# Patient Record
Sex: Female | Born: 1975 | Race: White | Hispanic: No | Marital: Married | Smoking: Former smoker
Health system: Southern US, Community
[De-identification: ages and names within clinical notes are randomized; demographics above are authoritative.]

## PROBLEM LIST (undated history)

## (undated) DIAGNOSIS — M1990 Unspecified osteoarthritis, unspecified site: Secondary | ICD-10-CM

## (undated) DIAGNOSIS — Q760 Spina bifida occulta: Secondary | ICD-10-CM

## (undated) DIAGNOSIS — E119 Type 2 diabetes mellitus without complications: Secondary | ICD-10-CM

## (undated) DIAGNOSIS — G43909 Migraine, unspecified, not intractable, without status migrainosus: Secondary | ICD-10-CM

## (undated) DIAGNOSIS — IMO0002 Reserved for concepts with insufficient information to code with codable children: Secondary | ICD-10-CM

## (undated) DIAGNOSIS — Z8489 Family history of other specified conditions: Secondary | ICD-10-CM

## (undated) DIAGNOSIS — K760 Fatty (change of) liver, not elsewhere classified: Secondary | ICD-10-CM

## (undated) DIAGNOSIS — T4145XA Adverse effect of unspecified anesthetic, initial encounter: Secondary | ICD-10-CM

## (undated) DIAGNOSIS — K219 Gastro-esophageal reflux disease without esophagitis: Secondary | ICD-10-CM

## (undated) DIAGNOSIS — F419 Anxiety disorder, unspecified: Secondary | ICD-10-CM

## (undated) DIAGNOSIS — I340 Nonrheumatic mitral (valve) insufficiency: Secondary | ICD-10-CM

## (undated) DIAGNOSIS — F32A Depression, unspecified: Secondary | ICD-10-CM

## (undated) DIAGNOSIS — T8859XA Other complications of anesthesia, initial encounter: Secondary | ICD-10-CM

## (undated) DIAGNOSIS — I10 Essential (primary) hypertension: Secondary | ICD-10-CM

## (undated) DIAGNOSIS — E7800 Pure hypercholesterolemia, unspecified: Secondary | ICD-10-CM

## (undated) DIAGNOSIS — F329 Major depressive disorder, single episode, unspecified: Secondary | ICD-10-CM

## (undated) HISTORY — DX: Depression, unspecified: F32.A

## (undated) HISTORY — PX: OTHER SURGICAL HISTORY: SHX169

## (undated) HISTORY — DX: Major depressive disorder, single episode, unspecified: F32.9

## (undated) HISTORY — PX: STERNAL WIRE REMOVAL: SHX2440

## (undated) HISTORY — DX: Anxiety disorder, unspecified: F41.9

## (undated) HISTORY — DX: Type 2 diabetes mellitus without complications: E11.9

## (undated) HISTORY — PX: NOSE SURGERY: SHX723

## (undated) HISTORY — PX: APPENDECTOMY: SHX54

## (undated) HISTORY — PX: ABDOMINAL HYSTERECTOMY: SHX81

---
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
## 2013-01-28 ENCOUNTER — Ambulatory Visit: Payer: Self-pay | Admitting: Internal Medicine

## 2013-02-07 ENCOUNTER — Ambulatory Visit: Payer: Self-pay | Admitting: Internal Medicine

## 2013-08-08 DIAGNOSIS — E119 Type 2 diabetes mellitus without complications: Secondary | ICD-10-CM | POA: Insufficient documentation

## 2013-08-08 DIAGNOSIS — I10 Essential (primary) hypertension: Secondary | ICD-10-CM | POA: Insufficient documentation

## 2013-08-08 DIAGNOSIS — F064 Anxiety disorder due to known physiological condition: Secondary | ICD-10-CM | POA: Insufficient documentation

## 2013-08-08 DIAGNOSIS — Q0700 Arnold-Chiari syndrome without spina bifida or hydrocephalus: Secondary | ICD-10-CM | POA: Insufficient documentation

## 2013-08-08 DIAGNOSIS — F32A Depression, unspecified: Secondary | ICD-10-CM | POA: Insufficient documentation

## 2013-08-08 DIAGNOSIS — F329 Major depressive disorder, single episode, unspecified: Secondary | ICD-10-CM | POA: Insufficient documentation

## 2013-08-24 DIAGNOSIS — M069 Rheumatoid arthritis, unspecified: Secondary | ICD-10-CM | POA: Insufficient documentation

## 2013-08-24 DIAGNOSIS — IMO0001 Reserved for inherently not codable concepts without codable children: Secondary | ICD-10-CM | POA: Insufficient documentation

## 2013-08-24 DIAGNOSIS — M359 Systemic involvement of connective tissue, unspecified: Secondary | ICD-10-CM | POA: Insufficient documentation

## 2013-08-24 DIAGNOSIS — M79642 Pain in left hand: Secondary | ICD-10-CM

## 2013-08-24 DIAGNOSIS — M25471 Effusion, right ankle: Secondary | ICD-10-CM | POA: Insufficient documentation

## 2013-08-24 DIAGNOSIS — M79641 Pain in right hand: Secondary | ICD-10-CM | POA: Insufficient documentation

## 2013-08-24 DIAGNOSIS — M797 Fibromyalgia: Secondary | ICD-10-CM | POA: Insufficient documentation

## 2013-09-12 DIAGNOSIS — M2550 Pain in unspecified joint: Secondary | ICD-10-CM | POA: Insufficient documentation

## 2013-09-12 DIAGNOSIS — M064 Inflammatory polyarthropathy: Secondary | ICD-10-CM | POA: Insufficient documentation

## 2013-09-12 DIAGNOSIS — Z8739 Personal history of other diseases of the musculoskeletal system and connective tissue: Secondary | ICD-10-CM | POA: Insufficient documentation

## 2013-09-12 DIAGNOSIS — E669 Obesity, unspecified: Secondary | ICD-10-CM | POA: Insufficient documentation

## 2014-01-17 ENCOUNTER — Ambulatory Visit: Payer: Self-pay | Admitting: Specialist

## 2014-01-27 DIAGNOSIS — G479 Sleep disorder, unspecified: Secondary | ICD-10-CM | POA: Insufficient documentation

## 2014-01-27 DIAGNOSIS — R519 Headache, unspecified: Secondary | ICD-10-CM | POA: Insufficient documentation

## 2014-01-27 DIAGNOSIS — R413 Other amnesia: Secondary | ICD-10-CM | POA: Insufficient documentation

## 2014-01-27 DIAGNOSIS — R51 Headache: Secondary | ICD-10-CM

## 2014-03-23 ENCOUNTER — Ambulatory Visit (INDEPENDENT_AMBULATORY_CARE_PROVIDER_SITE_OTHER): Payer: BC Managed Care – PPO | Admitting: Podiatry

## 2014-03-23 ENCOUNTER — Ambulatory Visit (INDEPENDENT_AMBULATORY_CARE_PROVIDER_SITE_OTHER): Payer: BC Managed Care – PPO

## 2014-03-23 ENCOUNTER — Encounter: Payer: Self-pay | Admitting: Podiatry

## 2014-03-23 VITALS — BP 111/72 | HR 75 | Resp 16 | Ht 63.0 in | Wt 260.0 lb

## 2014-03-23 DIAGNOSIS — M722 Plantar fascial fibromatosis: Secondary | ICD-10-CM

## 2014-03-23 DIAGNOSIS — M7661 Achilles tendinitis, right leg: Secondary | ICD-10-CM

## 2014-03-23 DIAGNOSIS — M7662 Achilles tendinitis, left leg: Secondary | ICD-10-CM

## 2014-03-23 DIAGNOSIS — M7660 Achilles tendinitis, unspecified leg: Secondary | ICD-10-CM

## 2014-03-23 DIAGNOSIS — M7750 Other enthesopathy of unspecified foot: Secondary | ICD-10-CM

## 2014-03-23 DIAGNOSIS — L600 Ingrowing nail: Secondary | ICD-10-CM

## 2014-03-23 DIAGNOSIS — M7671 Peroneal tendinitis, right leg: Secondary | ICD-10-CM

## 2014-03-23 MED ORDER — TRIAMCINOLONE ACETONIDE 10 MG/ML IJ SUSP: 10.0000 mg | Freq: Once | INTRAMUSCULAR | Status: AC

## 2014-03-23 NOTE — Progress Notes (Signed)
Subjective:    Patient ID: Amanda English, female    DOB: [birth year], 38 y.o.   MRN: [id number]  HPI Comments: 38 year old female presents the office today with bilateral foot pain with a right greater than left. She states that she has pain on the bottom and back of her heel, and in her right ankle. She states the pain has been ongoing for period of time. She states that she has pain in the ED and is on the morning or after periods of rest and gets some relief with ambulation. She states that she is unable to wear sandals and she only gets relief and wearing a tennis shoe. He presents her heels been hurting she been walking more on the outside of her right foot. Denies any trauma to the area. She also has secondary complaints of ingrown toenails of bilateral hallux. She denies any redness or drainage at this time. No other complaints at this time.    Foot Pain Associated symptoms include headaches.      Review of Systems  HENT: Positive for sinus pressure.   Cardiovascular: Positive for leg swelling.  Endocrine: Positive for heat intolerance.  Musculoskeletal:       Joint pain Difficulty walking Muscle pain  Skin:       Thick scars  Allergic/Immunologic: Positive for environmental allergies.  Neurological: Positive for headaches.  Psychiatric/Behavioral: The patient is nervous/anxious.   All other systems reviewed and are negative.      Objective:   Physical Exam AAO x3, NAD DP/PT pulses palpable 2/4 b/l, CRT < 3sec Bursitis sensation intact with Simms Weinstein monofilament, vibratory sensation intact, Achilles tendon reflex intact. Bilateral heel pain on the plantar aspect of the heels near the insertion of the plantar fascia. No pain along the course of the plantar fascia within the arch is bilaterally. Posterior heel pain along the calcaneus along the lateral aspect. Mild pain on the insertion of the Achilles tendon, no pain along the rest of the course of the Achilles  tendon.. Achilles tendon and plantar fascia appear to be intact. Equinus bilaterally. Decrease in medial arch height upon weightbearing. Right lateral ankle pain with mild tenderness over the lateral ankle ligaments and along the course of the peroneal tendon behind the fibula. Peroneals appear to be intact. No pinpoint bony pain and no pain with vibration over the fibula. MMT 5/5, ROM WNL.  No open lesions, no pre-ulcerative lesions. Bilateral hallux nails with incurvation of both the medial and lateral borders. Currently no erythema, drainage from the area, no tenderness on palpation. No leg pain, warmth, edema b/l      Assessment & Plan:  38 year old female with bilateral plantar fasciitis, insertional Achilles tendinitis. Right ankle pain likely result of compensation. -X-rays were obtained and reviewed with the patient. See x-ray report for full details. No acute fracture -Surgical versus conservative treatment discussed with patient including alternatives, risks, complications.  1. Bilateral plantar fasciitis -This time patient elects to proceed with a steroid injection into each of the heels around the area to her fascia. Under sterile conditions a total of 2.5 cc of a mixture of Kenalog 10, 0.5% Marcaine plain, 2% lidocaine plain was infiltrated in the symptomatic area. Patient tolerated the injection well without complications. -Ice to the effected area. -Stretching exercises discussed. -Shoe gear modifications discussed. -Plantar fascial brace. 2. Insertional Achilles tendinitis. -Discussed stretching exercises. -Ice to the effected area. 3. Ankle pain, right -This is likely result of compensation due to the heel pain. Currently  there is no pain on the fibula. -Continue with icing of this area. -Will monitor. If worsening or not any better at the followup appointment will consider further treatment. 4. Ingrown toenail  -Stressed with conservative and surgical options for the  patient due to her chronic recurrent ingrown nails. At this time the patient wishes to proceed with periodic nail debridement. If the nail becomes infected she would proceed with a procedure but for now she will hold off and she understands the risks.  -Follow up in one month or sooner any problems are to arise or any change in symptoms. Call with any questions or concerns.

## 2014-03-23 NOTE — Patient Instructions (Signed)
Plantar Fasciitis (Heel Spur Syndrome) with Rehab The plantar fascia is a fibrous, ligament-like, soft-tissue structure that spans the bottom of the foot. Plantar fasciitis is a condition that causes pain in the foot due to inflammation of the tissue. SYMPTOMS   Pain and tenderness on the underneath side of the foot.  Pain that worsens with standing or walking. CAUSES  Plantar fasciitis is caused by irritation and injury to the plantar fascia on the underneath side of the foot. Common mechanisms of injury include:  Direct trauma to bottom of the foot.  Damage to a small nerve that runs under the foot where the main fascia attaches to the heel bone.  Stress placed on the plantar fascia due to bone spurs. RISK INCREASES WITH:   Activities that place stress on the plantar fascia (running, jumping, pivoting, or cutting).  Poor strength and flexibility.  Improperly fitted shoes.  Tight calf muscles.  Flat feet.  Failure to warm-up properly before activity.  Obesity. PREVENTION  Warm up and stretch properly before activity.  Allow for adequate recovery between workouts.  Maintain physical fitness:  Strength, flexibility, and endurance.  Cardiovascular fitness.  Maintain a health body weight.  Avoid stress on the plantar fascia.  Wear properly fitted shoes, including arch supports for individuals who have flat feet. PROGNOSIS  If treated properly, then the symptoms of plantar fasciitis usually resolve without surgery. However, occasionally surgery is necessary. RELATED COMPLICATIONS   Recurrent symptoms that may result in a chronic condition.  Problems of the lower back that are caused by compensating for the injury, such as limping.  Pain or weakness of the foot during push-off following surgery.  Chronic inflammation, scarring, and partial or complete fascia tear, occurring more often from repeated injections. TREATMENT  Treatment initially involves the use of  ice and medication to help reduce pain and inflammation. The use of strengthening and stretching exercises may help reduce pain with activity, especially stretches of the Achilles tendon. These exercises may be performed at home or with a therapist. Your caregiver may recommend that you use heel cups of arch supports to help reduce stress on the plantar fascia. Occasionally, corticosteroid injections are given to reduce inflammation. If symptoms persist for greater than 6 months despite non-surgical (conservative), then surgery may be recommended.  MEDICATION   If pain medication is necessary, then nonsteroidal anti-inflammatory medications, such as aspirin and ibuprofen, or other minor pain relievers, such as acetaminophen, are often recommended.  Do not take pain medication within 7 days before surgery.  Prescription pain relievers may be given if deemed necessary by your caregiver. Use only as directed and only as much as you need.  Corticosteroid injections may be given by your caregiver. These injections should be reserved for the most serious cases, because they may only be given a certain number of times. HEAT AND COLD  Cold treatment (icing) relieves pain and reduces inflammation. Cold treatment should be applied for 10 to 15 minutes every 2 to 3 hours for inflammation and pain and immediately after any activity that aggravates your symptoms. Use ice packs or massage the area with a piece of ice (ice massage).  Heat treatment may be used prior to performing the stretching and strengthening activities prescribed by your caregiver, physical therapist, or athletic trainer. Use a heat pack or soak the injury in warm water. SEEK IMMEDIATE MEDICAL CARE IF:  Treatment seems to offer no benefit, or the condition worsens.  Any medications produce adverse side effects. EXERCISES RANGE   OF MOTION (ROM) AND STRETCHING EXERCISES - Plantar Fasciitis (Heel Spur Syndrome) These exercises may help you  when beginning to rehabilitate your injury. Your symptoms may resolve with or without further involvement from your physician, physical therapist or athletic trainer. While completing these exercises, remember:   Restoring tissue flexibility helps normal motion to return to the joints. This allows healthier, less painful movement and activity.  An effective stretch should be held for at least 30 seconds.  A stretch should never be painful. You should only feel a gentle lengthening or release in the stretched tissue. RANGE OF MOTION - Toe Extension, Flexion  Sit with your right / left leg crossed over your opposite knee.  Grasp your toes and gently pull them back toward the top of your foot. You should feel a stretch on the bottom of your toes and/or foot.  Hold this stretch for __________ seconds.  Now, gently pull your toes toward the bottom of your foot. You should feel a stretch on the top of your toes and or foot.  Hold this stretch for __________ seconds. Repeat __________ times. Complete this stretch __________ times per day.  RANGE OF MOTION - Ankle Dorsiflexion, Active Assisted  Remove shoes and sit on a chair that is preferably not on a carpeted surface.  Place right / left foot under knee. Extend your opposite leg for support.  Keeping your heel down, slide your right / left foot back toward the chair until you feel a stretch at your ankle or calf. If you do not feel a stretch, slide your bottom forward to the edge of the chair, while still keeping your heel down.  Hold this stretch for __________ seconds. Repeat __________ times. Complete this stretch __________ times per day.  STRETCH - Gastroc, Standing  Place hands on wall.  Extend right / left leg, keeping the front knee somewhat bent.  Slightly point your toes inward on your back foot.  Keeping your right / left heel on the floor and your knee straight, shift your weight toward the wall, not allowing your back to  arch.  You should feel a gentle stretch in the right / left calf. Hold this position for __________ seconds. Repeat __________ times. Complete this stretch __________ times per day. STRETCH - Soleus, Standing  Place hands on wall.  Extend right / left leg, keeping the other knee somewhat bent.  Slightly point your toes inward on your back foot.  Keep your right / left heel on the floor, bend your back knee, and slightly shift your weight over the back leg so that you feel a gentle stretch deep in your back calf.  Hold this position for __________ seconds. Repeat __________ times. Complete this stretch __________ times per day. STRETCH - Gastrocsoleus, Standing  Note: This exercise can place a lot of stress on your foot and ankle. Please complete this exercise only if specifically instructed by your caregiver.   Place the ball of your right / left foot on a step, keeping your other foot firmly on the same step.  Hold on to the wall or a rail for balance.  Slowly lift your other foot, allowing your body weight to press your heel down over the edge of the step.  You should feel a stretch in your right / left calf.  Hold this position for __________ seconds.  Repeat this exercise with a slight bend in your right / left knee. Repeat __________ times. Complete this stretch __________ times per day.    STRENGTHENING EXERCISES - Plantar Fasciitis (Heel Spur Syndrome)  These exercises may help you when beginning to rehabilitate your injury. They may resolve your symptoms with or without further involvement from your physician, physical therapist or athletic trainer. While completing these exercises, remember:   Muscles can gain both the endurance and the strength needed for everyday activities through controlled exercises.  Complete these exercises as instructed by your physician, physical therapist or athletic trainer. Progress the resistance and repetitions only as guided. STRENGTH -  Towel Curls  Sit in a chair positioned on a non-carpeted surface.  Place your foot on a towel, keeping your heel on the floor.  Pull the towel toward your heel by only curling your toes. Keep your heel on the floor.  If instructed by your physician, physical therapist or athletic trainer, add ____________________ at the end of the towel. Repeat __________ times. Complete this exercise __________ times per day. STRENGTH - Ankle Inversion  Secure one end of a rubber exercise band/tubing to a fixed object (table, pole). Loop the other end around your foot just before your toes.  Place your fists between your knees. This will focus your strengthening at your ankle.  Slowly, pull your big toe up and in, making sure the band/tubing is positioned to resist the entire motion.  Hold this position for __________ seconds.  Have your muscles resist the band/tubing as it slowly pulls your foot back to the starting position. Repeat __________ times. Complete this exercises __________ times per day.  Document Released: 07/07/2005 Document Revised: 09/29/2011 Document Reviewed: 10/19/2008 [employer] Patient Information 2015 [employer]. This information is not intended to replace advice given to you by your health care provider. Make sure you discuss any questions you have with your health care provider.  

## 2014-04-14 DIAGNOSIS — Z720 Tobacco use: Secondary | ICD-10-CM | POA: Insufficient documentation

## 2014-04-20 ENCOUNTER — Ambulatory Visit: Payer: BC Managed Care – PPO | Admitting: Podiatry

## 2014-07-11 ENCOUNTER — Ambulatory Visit (INDEPENDENT_AMBULATORY_CARE_PROVIDER_SITE_OTHER): Payer: BC Managed Care – PPO | Admitting: Podiatry

## 2014-07-11 ENCOUNTER — Encounter: Payer: Self-pay | Admitting: Podiatry

## 2014-07-11 VITALS — BP 112/75 | HR 93 | Resp 18

## 2014-07-11 DIAGNOSIS — M722 Plantar fascial fibromatosis: Secondary | ICD-10-CM

## 2014-07-11 MED ORDER — TRIAMCINOLONE ACETONIDE 10 MG/ML IJ SUSP: 10.0000 mg | Freq: Once | INTRAMUSCULAR | Status: AC

## 2014-07-11 NOTE — Patient Instructions (Signed)
Plantar Fasciitis (Heel Spur Syndrome) with Rehab The plantar fascia is a fibrous, ligament-like, soft-tissue structure that spans the bottom of the foot. Plantar fasciitis is a condition that causes pain in the foot due to inflammation of the tissue. SYMPTOMS   Pain and tenderness on the underneath side of the foot.  Pain that worsens with standing or walking. CAUSES  Plantar fasciitis is caused by irritation and injury to the plantar fascia on the underneath side of the foot. Common mechanisms of injury include:  Direct trauma to bottom of the foot.  Damage to a small nerve that runs under the foot where the main fascia attaches to the heel bone.  Stress placed on the plantar fascia due to bone spurs. RISK INCREASES WITH:   Activities that place stress on the plantar fascia (running, jumping, pivoting, or cutting).  Poor strength and flexibility.  Improperly fitted shoes.  Tight calf muscles.  Flat feet.  Failure to warm-up properly before activity.  Obesity. PREVENTION  Warm up and stretch properly before activity.  Allow for adequate recovery between workouts.  Maintain physical fitness:  Strength, flexibility, and endurance.  Cardiovascular fitness.  Maintain a health body weight.  Avoid stress on the plantar fascia.  Wear properly fitted shoes, including arch supports for individuals who have flat feet. PROGNOSIS  If treated properly, then the symptoms of plantar fasciitis usually resolve without surgery. However, occasionally surgery is necessary. RELATED COMPLICATIONS   Recurrent symptoms that may result in a chronic condition.  Problems of the lower back that are caused by compensating for the injury, such as limping.  Pain or weakness of the foot during push-off following surgery.  Chronic inflammation, scarring, and partial or complete fascia tear, occurring more often from repeated injections. TREATMENT  Treatment initially involves the use of  ice and medication to help reduce pain and inflammation. The use of strengthening and stretching exercises may help reduce pain with activity, especially stretches of the Achilles tendon. These exercises may be performed at home or with a therapist. Your caregiver may recommend that you use heel cups of arch supports to help reduce stress on the plantar fascia. Occasionally, corticosteroid injections are given to reduce inflammation. If symptoms persist for greater than 6 months despite non-surgical (conservative), then surgery may be recommended.  MEDICATION   If pain medication is necessary, then nonsteroidal anti-inflammatory medications, such as aspirin and ibuprofen, or other minor pain relievers, such as acetaminophen, are often recommended.  Do not take pain medication within 7 days before surgery.  Prescription pain relievers may be given if deemed necessary by your caregiver. Use only as directed and only as much as you need.  Corticosteroid injections may be given by your caregiver. These injections should be reserved for the most serious cases, because they may only be given a certain number of times. HEAT AND COLD  Cold treatment (icing) relieves pain and reduces inflammation. Cold treatment should be applied for 10 to 15 minutes every 2 to 3 hours for inflammation and pain and immediately after any activity that aggravates your symptoms. Use ice packs or massage the area with a piece of ice (ice massage).  Heat treatment may be used prior to performing the stretching and strengthening activities prescribed by your caregiver, physical therapist, or athletic trainer. Use a heat pack or soak the injury in warm water. SEEK IMMEDIATE MEDICAL CARE IF:  Treatment seems to offer no benefit, or the condition worsens.  Any medications produce adverse side effects. EXERCISES RANGE   OF MOTION (ROM) AND STRETCHING EXERCISES - Plantar Fasciitis (Heel Spur Syndrome) These exercises may help you  when beginning to rehabilitate your injury. Your symptoms may resolve with or without further involvement from your physician, physical therapist or athletic trainer. While completing these exercises, remember:   Restoring tissue flexibility helps normal motion to return to the joints. This allows healthier, less painful movement and activity.  An effective stretch should be held for at least 30 seconds.  A stretch should never be painful. You should only feel a gentle lengthening or release in the stretched tissue. RANGE OF MOTION - Toe Extension, Flexion  Sit with your right / left leg crossed over your opposite knee.  Grasp your toes and gently pull them back toward the top of your foot. You should feel a stretch on the bottom of your toes and/or foot.  Hold this stretch for __________ seconds.  Now, gently pull your toes toward the bottom of your foot. You should feel a stretch on the top of your toes and or foot.  Hold this stretch for __________ seconds. Repeat __________ times. Complete this stretch __________ times per day.  RANGE OF MOTION - Ankle Dorsiflexion, Active Assisted  Remove shoes and sit on a chair that is preferably not on a carpeted surface.  Place right / left foot under knee. Extend your opposite leg for support.  Keeping your heel down, slide your right / left foot back toward the chair until you feel a stretch at your ankle or calf. If you do not feel a stretch, slide your bottom forward to the edge of the chair, while still keeping your heel down.  Hold this stretch for __________ seconds. Repeat __________ times. Complete this stretch __________ times per day.  STRETCH - Gastroc, Standing  Place hands on wall.  Extend right / left leg, keeping the front knee somewhat bent.  Slightly point your toes inward on your back foot.  Keeping your right / left heel on the floor and your knee straight, shift your weight toward the wall, not allowing your back to  arch.  You should feel a gentle stretch in the right / left calf. Hold this position for __________ seconds. Repeat __________ times. Complete this stretch __________ times per day. STRETCH - Soleus, Standing  Place hands on wall.  Extend right / left leg, keeping the other knee somewhat bent.  Slightly point your toes inward on your back foot.  Keep your right / left heel on the floor, bend your back knee, and slightly shift your weight over the back leg so that you feel a gentle stretch deep in your back calf.  Hold this position for __________ seconds. Repeat __________ times. Complete this stretch __________ times per day. STRETCH - Gastrocsoleus, Standing  Note: This exercise can place a lot of stress on your foot and ankle. Please complete this exercise only if specifically instructed by your caregiver.   Place the ball of your right / left foot on a step, keeping your other foot firmly on the same step.  Hold on to the wall or a rail for balance.  Slowly lift your other foot, allowing your body weight to press your heel down over the edge of the step.  You should feel a stretch in your right / left calf.  Hold this position for __________ seconds.  Repeat this exercise with a slight bend in your right / left knee. Repeat __________ times. Complete this stretch __________ times per day.    STRENGTHENING EXERCISES - Plantar Fasciitis (Heel Spur Syndrome)  These exercises may help you when beginning to rehabilitate your injury. They may resolve your symptoms with or without further involvement from your physician, physical therapist or athletic trainer. While completing these exercises, remember:   Muscles can gain both the endurance and the strength needed for everyday activities through controlled exercises.  Complete these exercises as instructed by your physician, physical therapist or athletic trainer. Progress the resistance and repetitions only as guided. STRENGTH -  Towel Curls  Sit in a chair positioned on a non-carpeted surface.  Place your foot on a towel, keeping your heel on the floor.  Pull the towel toward your heel by only curling your toes. Keep your heel on the floor.  If instructed by your physician, physical therapist or athletic trainer, add ____________________ at the end of the towel. Repeat __________ times. Complete this exercise __________ times per day. STRENGTH - Ankle Inversion  Secure one end of a rubber exercise band/tubing to a fixed object (table, pole). Loop the other end around your foot just before your toes.  Place your fists between your knees. This will focus your strengthening at your ankle.  Slowly, pull your big toe up and in, making sure the band/tubing is positioned to resist the entire motion.  Hold this position for __________ seconds.  Have your muscles resist the band/tubing as it slowly pulls your foot back to the starting position. Repeat __________ times. Complete this exercises __________ times per day.  Document Released: 07/07/2005 Document Revised: 09/29/2011 Document Reviewed: 10/19/2008 [employer] Patient Information 2015 [employer]. This information is not intended to replace advice given to you by your health care provider. Make sure you discuss any questions you have with your health care provider.  

## 2014-07-17 NOTE — Progress Notes (Signed)
Patient ID: Amanda English, female   DOB: [birth year], 38 y.o.   MRN: [id number]  Subjective: 38 year old female presents the office they for follow-up evaluation of bilateral foot pain, likely plantar fasciitis. After last appointment the patient states that she had resolution of symptoms after receiving the injection however over the last couple weeks she has started had some reoccurrence of symptoms. She denies any swelling to the area. Denies any systemic complaints as fevers, chills, nausea, vomiting. No other complaints at this time in no acute changes since last appointment.  Objective: AAO 3, NAD DP/PT pulses palpable bilaterally, CRT less than 3 seconds Protective sensation intact with Simms Weinstein monofilament, vibratory sensation intact, Achilles tendon reflex intact. Tenderness palpation overlying the plantar medial tubercle of the calcaneus at the insertion of the plantar fascia bilaterally. There is no pain along the course of plantar fascial in the arch of the foot and the plantar fascia appears to be intact. There is no pain with lateral compression of the calcaneus or pain with vibratory sensation bilaterally. No pain along the posterior aspect of the calcaneus or along the course/insertion of the Achilles tendon bilaterally. There is no overlying edema, erythema, increase in warmth bilaterally. No other areas of pinpoint bony tenderness or pain with vibratory sensation to bilateral foot/ankles. MMT 5/5, ROM WNL No open lesions or pre-ulcerative lesions. No pain with calf compression, swelling, warmth, erythema  Assessment: 38 year old female with bilateral heel pain, likely plantar fasciitis  Plan: -Treatment options were discussed the patient including alternatives, risks, complications. -Patient elects to proceed with steroid injection into the bilateral plantar heels. Under sterile skin preparation, a total of 2.5cc of kenalog 10, 0.5% Marcaine plain, and 2% lidocaine plain  were infiltrated into the symptomatic area without complication. A band-aid was applied. Patient tolerated the injection well without complication. Discussed the patient to ice the area over the next couple of days to help prevent a steroid flare. -Continue stretching exercises -Ice to the area -Discussed shoe gear modifications and orthotics. -Follow-up in 4 weeks or sooner should any problems arise. In the meantime, call the office any course, concerns, change in symptoms.

## 2014-11-12 ENCOUNTER — Emergency Department
Admit: 2014-11-12 | Disposition: A | Discharge status: Home / Self Care | Payer: Self-pay | Admitting: Emergency Medicine

## 2014-11-12 LAB — URINALYSIS, COMPLETE
BACTERIA: NONE SEEN
BILIRUBIN, UR: NEGATIVE
BLOOD: NEGATIVE
GLUCOSE, UR: NEGATIVE mg/dL (ref 0–75)
KETONE: NEGATIVE
Leukocyte Esterase: NEGATIVE
NITRITE: NEGATIVE
Ph: 6 (ref 4.5–8.0)
Protein: NEGATIVE
RBC,UR: NONE SEEN /HPF (ref 0–5)
Specific Gravity: 1.002 (ref 1.003–1.030)

## 2014-11-12 LAB — CBC WITH DIFFERENTIAL/PLATELET
BASOS ABS: 0.1 10*3/uL (ref 0.0–0.1)
Basophil %: 0.6 %
Eosinophil #: 0.1 10*3/uL (ref 0.0–0.7)
Eosinophil %: 0.8 %
HCT: 38.8 % (ref 35.0–47.0)
HGB: 13.1 g/dL (ref 12.0–16.0)
LYMPHS ABS: 3.1 10*3/uL (ref 1.0–3.6)
Lymphocyte %: 21.4 %
MCH: 30.3 pg (ref 26.0–34.0)
MCHC: 33.6 g/dL (ref 32.0–36.0)
MCV: 90 fL (ref 80–100)
MONO ABS: 1.4 x10 3/mm — AB (ref 0.2–0.9)
MONOS PCT: 9.7 %
Neutrophil #: 9.6 10*3/uL — ABNORMAL HIGH (ref 1.4–6.5)
Neutrophil %: 67.5 %
Platelet: 280 10*3/uL (ref 150–440)
RBC: 4.31 10*6/uL (ref 3.80–5.20)
RDW: 14.3 % (ref 11.5–14.5)
WBC: 14.3 10*3/uL — AB (ref 3.6–11.0)

## 2014-11-12 LAB — COMPREHENSIVE METABOLIC PANEL
ANION GAP: 10 (ref 7–16)
AST: 19 U/L
Albumin: 3.9 g/dL
Alkaline Phosphatase: 96 U/L
BUN: 8 mg/dL
Bilirubin,Total: 0.4 mg/dL
CHLORIDE: 102 mmol/L
CO2: 22 mmol/L
Calcium, Total: 8.8 mg/dL — ABNORMAL LOW
Creatinine: 0.58 mg/dL
EGFR (African American): >60
EGFR (Non-African Amer.): >60
Glucose: 138 mg/dL — ABNORMAL HIGH
Potassium: 3.3 mmol/L — ABNORMAL LOW
SGPT (ALT): 22 U/L
Sodium: 134 mmol/L — ABNORMAL LOW
Total Protein: 7.4 g/dL

## 2014-11-12 LAB — D-DIMER(ARMC): D-Dimer: 643 ng/ml

## 2014-11-12 LAB — TROPONIN I: Troponin-I: <0.03 ng/mL

## 2014-11-12 LAB — PRO B NATRIURETIC PEPTIDE: B-Type Natriuretic Peptide: 7 pg/mL

## 2015-03-23 DIAGNOSIS — G43909 Migraine, unspecified, not intractable, without status migrainosus: Secondary | ICD-10-CM | POA: Insufficient documentation

## 2015-04-17 DIAGNOSIS — E785 Hyperlipidemia, unspecified: Secondary | ICD-10-CM | POA: Insufficient documentation

## 2015-05-16 ENCOUNTER — Emergency Department
Admission: EM | Admit: 2015-05-16 | Discharge: 2015-05-16 | Disposition: A | Discharge status: Home / Self Care | Payer: No Typology Code available for payment source | Attending: Emergency Medicine | Admitting: Emergency Medicine

## 2015-05-16 ENCOUNTER — Encounter: Payer: Self-pay | Admitting: *Deleted

## 2015-05-16 DIAGNOSIS — E119 Type 2 diabetes mellitus without complications: Secondary | ICD-10-CM | POA: Diagnosis not present

## 2015-05-16 DIAGNOSIS — Z79899 Other long term (current) drug therapy: Secondary | ICD-10-CM | POA: Diagnosis not present

## 2015-05-16 DIAGNOSIS — Z791 Long term (current) use of non-steroidal anti-inflammatories (NSAID): Secondary | ICD-10-CM | POA: Insufficient documentation

## 2015-05-16 DIAGNOSIS — G43909 Migraine, unspecified, not intractable, without status migrainosus: Secondary | ICD-10-CM | POA: Diagnosis not present

## 2015-05-16 DIAGNOSIS — I10 Essential (primary) hypertension: Secondary | ICD-10-CM | POA: Insufficient documentation

## 2015-05-16 DIAGNOSIS — Z792 Long term (current) use of antibiotics: Secondary | ICD-10-CM | POA: Insufficient documentation

## 2015-05-16 DIAGNOSIS — Z720 Tobacco use: Secondary | ICD-10-CM | POA: Insufficient documentation

## 2015-05-16 DIAGNOSIS — G43009 Migraine without aura, not intractable, without status migrainosus: Secondary | ICD-10-CM

## 2015-05-16 HISTORY — DX: Unspecified osteoarthritis, unspecified site: M19.90

## 2015-05-16 HISTORY — DX: Nonrheumatic mitral (valve) insufficiency: I34.0

## 2015-05-16 HISTORY — DX: Reserved for concepts with insufficient information to code with codable children: IMO0002

## 2015-05-16 HISTORY — DX: Type 2 diabetes mellitus without complications: E11.9

## 2015-05-16 HISTORY — DX: Pure hypercholesterolemia, unspecified: E78.00

## 2015-05-16 MED ORDER — METOCLOPRAMIDE HCL 5 MG/ML IJ SOLN
10.0000 mg | Freq: Once | INTRAMUSCULAR | Status: AC
  Administered 2015-05-16: 10 mg via INTRAVENOUS
  Filled 2015-05-16: qty 2

## 2015-05-16 MED ORDER — DIPHENHYDRAMINE HCL 50 MG/ML IJ SOLN
INTRAMUSCULAR | Status: AC
Start: 2015-05-16 — End: 2015-05-16
  Administered 2015-05-16: 25 mg via INTRAVENOUS
  Filled 2015-05-16: qty 1

## 2015-05-16 MED ORDER — DIPHENHYDRAMINE HCL 50 MG/ML IJ SOLN
25.0000 mg | Freq: Once | INTRAMUSCULAR | Status: AC
  Administered 2015-05-16: 25 mg via INTRAVENOUS

## 2015-05-16 MED ORDER — KETOROLAC TROMETHAMINE 30 MG/ML IJ SOLN
15.0000 mg | Freq: Once | INTRAMUSCULAR | Status: AC
  Administered 2015-05-16: 15 mg via INTRAVENOUS

## 2015-05-16 MED ORDER — METOCLOPRAMIDE HCL 5 MG/ML IJ SOLN
INTRAMUSCULAR | Status: AC
Start: 2015-05-16 — End: 2015-05-16
  Administered 2015-05-16: 10 mg via INTRAVENOUS
  Filled 2015-05-16: qty 2

## 2015-05-16 MED ORDER — DEXAMETHASONE SODIUM PHOSPHATE 10 MG/ML IJ SOLN
INTRAMUSCULAR | Status: AC
Start: 2015-05-16 — End: 2015-05-16
  Administered 2015-05-16: 10 mg via INTRAVENOUS
  Filled 2015-05-16: qty 1

## 2015-05-16 MED ORDER — DEXAMETHASONE SODIUM PHOSPHATE 10 MG/ML IJ SOLN
10.0000 mg | Freq: Once | INTRAMUSCULAR | Status: AC
  Administered 2015-05-16: 10 mg via INTRAVENOUS
  Filled 2015-05-16: qty 1

## 2015-05-16 MED ORDER — SODIUM CHLORIDE 0.9 % IV BOLUS (SEPSIS)
500.0000 mL | INTRAVENOUS | Status: AC
  Administered 2015-05-16: 500 mL via INTRAVENOUS

## 2015-05-16 MED ORDER — KETOROLAC TROMETHAMINE 30 MG/ML IJ SOLN
INTRAMUSCULAR | Status: AC
  Administered 2015-05-16: 15 mg via INTRAVENOUS
  Filled 2015-05-16: qty 1

## 2015-05-16 NOTE — ED Notes (Signed)
Pt reports migraine x 3 days. Hx of kiari malformation, states pain similar to past headaches. Taking medications, but not helping.

## 2015-05-16 NOTE — Discharge Instructions (Signed)
You have been seen in the Emergency Department (ED) for a migraine.  Please use Tylenol or Motrin as needed for symptoms, but only as written on the box, and take any regular medications that have been prescribed for you. °As we have discussed, please follow up with your doctor as soon as possible regarding today’s ED visit and your headache symptoms.   ° °Call your doctor or return to the Emergency Department (ED) if you have a worsening headache, sudden and severe headache, confusion, slurred speech, facial droop, weakness or numbness in any arm or leg, extreme fatigue, or other symptoms that concern you. ° ° °Migraine Headache °A migraine headache is an intense, throbbing pain on one or both sides of your head. A migraine can last for 30 minutes to several hours. °CAUSES  °The exact cause of a migraine headache is not always known. However, a migraine may be caused when nerves in the brain become irritated and release chemicals that cause inflammation. This causes pain. °Certain things may also trigger migraines, such as: °· Alcohol. °· Smoking. °· Stress. °· Menstruation. °· Aged cheeses. °· Foods or drinks that contain nitrates, glutamate, aspartame, or tyramine. °· Lack of sleep. °· Chocolate. °· Caffeine. °· Hunger. °· Physical exertion. °· Fatigue. °· Medicines used to treat chest pain (nitroglycerine), birth control pills, estrogen, and some blood pressure medicines. °SIGNS AND SYMPTOMS °· Pain on one or both sides of your head. °· Pulsating or throbbing pain. °· Severe pain that prevents daily activities. °· Pain that is aggravated by any physical activity. °· Nausea, vomiting, or both. °· Dizziness. °· Pain with exposure to bright lights, loud noises, or activity. °· General sensitivity to bright lights, loud noises, or smells. °Before you get a migraine, you may get warning signs that a migraine is coming (aura). An aura may include: °· Seeing flashing lights. °· Seeing bright spots, halos, or zigzag  lines. °· Having tunnel vision or blurred vision. °· Having feelings of numbness or tingling. °· Having trouble talking. °· Having muscle weakness. °DIAGNOSIS  °A migraine headache is often diagnosed based on: °· Symptoms. °· Physical exam. °· A CT scan or MRI of your head. These imaging tests cannot diagnose migraines, but they can help rule out other causes of headaches. °TREATMENT °Medicines may be given for pain and nausea. Medicines can also be given to help prevent recurrent migraines.  °HOME CARE INSTRUCTIONS °· Only take over-the-counter or prescription medicines for pain or discomfort as directed by your health care provider. The use of long-term narcotics is not recommended. °· Lie down in a dark, quiet room when you have a migraine. °· Keep a journal to find out what may trigger your migraine headaches. For example, write down: °¨ What you eat and drink. °¨ How much sleep you get. °¨ Any change to your diet or medicines. °· Limit alcohol consumption. °· Quit smoking if you smoke. °· Get 7-9 hours of sleep, or as recommended by your health care provider. °· Limit stress. °· Keep lights dim if bright lights bother you and make your migraines worse. °SEEK IMMEDIATE MEDICAL CARE IF:  °· Your migraine becomes severe. °· You have a fever. °· You have a stiff neck. °· You have vision loss. °· You have muscular weakness or loss of muscle control. °· You start losing your balance or have trouble walking. °· You feel faint or pass out. °· You have severe symptoms that are different from your first symptoms. °MAKE SURE YOU:  °·   Understand these instructions. °· Will watch your condition. °· Will get help right away if you are not doing well or get worse. °  °This information is not intended to replace advice given to you by your health care provider. Make sure you discuss any questions you have with your health care provider. °  °Document Released: 07/07/2005 Document Revised: 07/28/2014 Document Reviewed:  03/14/2013 °[employer] Interactive Patient Education ©2016 [employer] ° ° ° °

## 2015-05-16 NOTE — ED Provider Notes (Signed)
[facility] Emergency Department Provider Note  ____________________________________________  Time seen: Approximately 3:58 PM  I have reviewed the triage vital signs and the nursing notes.   HISTORY  Chief Complaint Migraine    HPI Amanda English is a 39 y.o. female with an extensive chronic medical history that includes morbid obesity, tobacco abuse, uncomplicated diabetes, Chiari malformation with chronic migraines,and rheumatoid arthritis who presents with 3 days of severe migraine.  She describes that it was gradual in onset, includes her whole head, endorses photophobia, LAD noises make it worse, she sees her usual aura of "black dots".  She states that this feels like her typical migraines except that it has been lasting longer and is more severe.  Her usual medications that include Topamax and has not been helping.  She sees Dr. Glenna Durand with neurology to manage her chronic issues.  She has not seen him recently.  Other than her usual auras, she has had no visual changes.  She denies fever/chills, chest pain, shortness of breath, abdominal pain, dysuria.  She has had nausea with no vomiting.   Past Medical History  Diagnosis Date  . Chiari malformation   . Arthritis   . Diabetes mellitus without complication (HCC)   . Mitral valve regurgitation   . High cholesterol     There are no active problems to display for this patient.   Past Surgical History  Procedure Laterality Date  . Nose surgery    . Appendectomy    . Cesarean section    . Sternal wire removal    . Abdominal hysterectomy      Current Outpatient Rx  Name  Route  Sig  Dispense  Refill  . ALPRAZolam (XANAX) 1 MG tablet   Oral   Take 1 mg by mouth.         Marland Kitchen atenolol (TENORMIN) 50 MG tablet   Oral   Take 50 mg by mouth.         . cetirizine (ZYRTEC) 10 MG tablet   Oral   Take 10 mg by mouth.         . chlorpheniramine-HYDROcodone (TUSSIONEX) 10-8 MG/5ML LQCR    Oral   Take by mouth.         . clonazePAM (KLONOPIN) 1 MG tablet   Oral   Take by mouth.         . clonazePAM (KLONOPIN) 1 MG tablet            0   . doxycycline (VIBRAMYCIN) 100 MG capsule   Oral   Take 100 mg by mouth 2 (two) times daily.      0   . estradiol (VIVELLE-DOT) 0.05 MG/24HR patch   Transdermal   Place onto the skin.         . furosemide (LASIX) 40 MG tablet   Oral   Take 40 mg by mouth.         . gabapentin (NEURONTIN) 100 MG capsule   Oral   Take 100 mg by mouth.         Marland Kitchen ketorolac (TORADOL) 10 MG tablet            0   . Ketorolac Tromethamine (SPRIX) 15.75 MG/SPRAY SOLN   Nasal   Place into the nose.         Marland Kitchen Ketorolac Tromethamine 15.75 MG/SPRAY SOLN      Ketorolac (Sprix) nasal spray 15.75 mg/spray in each nostril every 6-8 hours for pain. 1 box         .  nortriptyline (PAMELOR) 10 MG capsule   Oral   Take 10 mg by mouth 2 (two) times daily.         . nortriptyline (PAMELOR) 10 MG capsule   Oral   Take by mouth.         . ondansetron (ZOFRAN) 8 MG tablet            0   . promethazine (PHENERGAN) 50 MG tablet   Oral   Take 50 mg by mouth.         . topiramate (TOPAMAX) 100 MG tablet   Oral   Take 100 mg by mouth.         . traMADol (ULTRAM) 50 MG tablet   Oral   Take 50 mg by mouth.         . traZODone (DESYREL) 50 MG tablet   Oral   Take 50 mg by mouth at bedtime.      0   . venlafaxine XR (EFFEXOR-XR) 37.5 MG 24 hr capsule   Oral   Take 37.5 mg by mouth.           Allergies Nsaids  No family history on file.  Social History Social History  Substance Use Topics  . Smoking status: Current Every Day Smoker  . Smokeless tobacco: None  . Alcohol Use: No    Review of Systems Constitutional: No fever/chills Eyes: No visual changes; chronic migraine aura of "black dots" ENT: No sore throat. Cardiovascular: Denies chest pain. Respiratory: Denies shortness of  breath. Gastrointestinal: No abdominal pain.  nausea, no vomiting.  No diarrhea.  No constipation. Genitourinary: Negative for dysuria. Musculoskeletal: Negative for back pain. Skin: Negative for rash. Neurological: Severe global migraine with photophobia similar to prior.  No focal neurological deficits.  10-point ROS otherwise negative.  ____________________________________________   PHYSICAL EXAM:  VITAL SIGNS: ED Triage Vitals  Enc Vitals Group     BP 05/16/15 1446 129/68 mmHg     Pulse Rate 05/16/15 1446 74     Resp 05/16/15 1446 16     Temp 05/16/15 1446 98 F (36.7 C)     Temp Source 05/16/15 1446 Oral     SpO2 05/16/15 1446 97 %     Weight --      Height --      Head Cir --      Peak Flow --      Pain Score 05/16/15 1443 7     Pain Loc --      Pain Edu? --      Excl. in GC? --     Constitutional: Alert and oriented.  Appears uncomfortable. Eyes: Conjunctivae are normal. PERRL. EOMI. normal funduscopic exam with no papilledema. Head: Atraumatic. Nose: No congestion/rhinnorhea. Mouth/Throat: Mucous membranes are moist.  Oropharynx non-erythematous. Neck: No stridor.   Cardiovascular: Normal rate, regular rhythm. Grossly normal heart sounds.  Good peripheral circulation. Respiratory: Normal respiratory effort.  No retractions. Lungs CTAB. Gastrointestinal: Soft and nontender. No distention. No abdominal bruits. No CVA tenderness. Musculoskeletal: No lower extremity tenderness nor edema.  No joint effusions. Neurologic:  Normal speech and language. No gross focal neurologic deficits are appreciated.  Skin:  Skin is warm, dry and intact. No rash noted.   ____________________________________________   LABS (all labs ordered are listed, but only abnormal results are displayed)  Labs Reviewed - No data to display ____________________________________________  EKG  Not indicated ____________________________________________  RADIOLOGY   No results  found.  ____________________________________________   PROCEDURES  Procedure(s) performed: None  Critical Care performed: No ____________________________________________   INITIAL IMPRESSION / ASSESSMENT AND PLAN / ED COURSE  Pertinent labs & imaging results that were available during my care of the patient were reviewed by me and considered in my medical decision making (see chart for details).  The patient has a history of chronic migraines which are managed by a local neurologist.  All of her signs and symptoms seem consistent with an acute migraine just more severe than normal.  She has had no trauma and there is no indication she has another emergent cause of headache, though as always the differential diagnosis includes but is not exclusive to subarachnoid hemorrhage, meningitis, encephalitis, previous head trauma, cavernous venous thrombosis, muscle tension headache, etc.  I will treat her empirically with migraine treatment with only 15 mg of Toradol since she already had a dose of by mouth Toradol today.  I will reassess after the medication.  There is no indication for imaging at this time.  ----------------------------------------- 6:04 PM on 05/16/2015 -----------------------------------------  The patient states that she feels much better.  She is ready to go home and follow-up as an outpatient.  I gave my usual and customary return precautions.  ____________________________________________  FINAL CLINICAL IMPRESSION(S) / ED DIAGNOSES  Final diagnoses:  Nonintractable migraine, unspecified migraine type      NEW MEDICATIONS STARTED DURING THIS VISIT:  Discharge Medication List as of 05/16/2015  6:08 PM       Loleta Rose, MD 05/16/15 2051

## 2015-06-18 DIAGNOSIS — G44229 Chronic tension-type headache, not intractable: Secondary | ICD-10-CM | POA: Insufficient documentation

## 2015-07-14 DIAGNOSIS — R413 Other amnesia: Secondary | ICD-10-CM | POA: Insufficient documentation

## 2015-08-28 ENCOUNTER — Encounter: Payer: Self-pay | Admitting: Podiatry

## 2015-08-28 ENCOUNTER — Ambulatory Visit (INDEPENDENT_AMBULATORY_CARE_PROVIDER_SITE_OTHER): Payer: No Typology Code available for payment source

## 2015-08-28 ENCOUNTER — Ambulatory Visit (INDEPENDENT_AMBULATORY_CARE_PROVIDER_SITE_OTHER): Payer: No Typology Code available for payment source | Admitting: Podiatry

## 2015-08-28 DIAGNOSIS — M19071 Primary osteoarthritis, right ankle and foot: Secondary | ICD-10-CM

## 2015-08-28 DIAGNOSIS — T148 Other injury of unspecified body region: Secondary | ICD-10-CM

## 2015-08-28 DIAGNOSIS — T148XXA Other injury of unspecified body region, initial encounter: Secondary | ICD-10-CM

## 2015-08-28 DIAGNOSIS — R52 Pain, unspecified: Secondary | ICD-10-CM

## 2015-08-28 NOTE — Progress Notes (Signed)
   Subjective:    Patient ID: Amanda English, female    DOB: 01/04/1976, 40 y.o.   MRN: [id number]  HPI  40 year old female presents the office with concerns of pain to her right big toe which started over the weekend after she dropped a full Yeti cub on her big toe. Since then she states that she has had pain in her big toe with pressure in certain shoes. Denies any swelling or any redness. No open sores. She is unable previous treatment. No other complaints at this time.  Review of Systems  All other systems reviewed and are negative.      Objective:   Physical Exam General: AAO x3, NAD  Dermatological: Skin is warm, dry and supple bilateral. Nails x 10 are well manicured; remaining integument appears unremarkable at this time. There are no open sores, no preulcerative lesions, no rash or signs of infection present.  Vascular: Dorsalis Pedis artery and Posterior Tibial artery pedal pulses are 2/4 bilateral with immedate capillary fill time. Pedal hair growth present. No varicosities and no lower extremity edema present bilateral. There is no pain with calf compression, swelling, warmth, erythema.   Neruologic: Grossly intact via light touch bilateral. Vibratory intact via tuning fork bilateral. Protective threshold with Semmes Wienstein monofilament intact to all pedal sites bilateral. Patellar and Achilles deep tendon reflexes 2+ bilateral. No Babinski or clonus noted bilateral.   Musculoskeletal: TTP to the hallux along the IPJ and to the submetatarsal 2 area on the right foot. There is mild pain with hallux IPJ ROM. There is also mild, diffuse midfoot tenderness to the right foot which started a day or more after the injury.  No pain, crepitus, or limitation noted with foot and ankle range of motion bilateral. Muscular strength 5/5 in all groups tested bilateral.  Gait: Unassisted, Nonantalgic.       Assessment & Plan:  40 year old female right hallux bone contusion with pain likely  due to compensation. -Treatment options discussed including all alternatives, risks, and complications -X-rays were obtained and reviewed with the patient. There is no definitive evidence of acute fracture or stress fracture identified at this time. -Given her pain to the toe she states that she's having difficulty one issue. Dispensed surgical shoe. -Ice and elevation. -Follow-up as scheduled or sooner if any problems arise. In the meantime, encouraged to call the office with any questions, concerns, change in symptoms.   Ovid Curd, DPM

## 2015-08-31 DIAGNOSIS — T148XXA Other injury of unspecified body region, initial encounter: Secondary | ICD-10-CM | POA: Insufficient documentation

## 2015-09-13 ENCOUNTER — Ambulatory Visit: Payer: No Typology Code available for payment source | Admitting: Podiatry

## 2015-12-06 ENCOUNTER — Encounter: Payer: Self-pay | Admitting: Podiatry

## 2015-12-06 ENCOUNTER — Ambulatory Visit (INDEPENDENT_AMBULATORY_CARE_PROVIDER_SITE_OTHER): Payer: No Typology Code available for payment source | Admitting: Podiatry

## 2015-12-06 DIAGNOSIS — M7661 Achilles tendinitis, right leg: Secondary | ICD-10-CM | POA: Insufficient documentation

## 2015-12-06 DIAGNOSIS — M722 Plantar fascial fibromatosis: Secondary | ICD-10-CM

## 2015-12-06 DIAGNOSIS — M7662 Achilles tendinitis, left leg: Secondary | ICD-10-CM | POA: Diagnosis not present

## 2015-12-06 MED ORDER — METHYLPREDNISOLONE 4 MG PO TBPK: ORAL_TABLET | ORAL | Status: DC

## 2015-12-06 NOTE — Patient Instructions (Signed)

## 2015-12-07 NOTE — Progress Notes (Signed)
Patient ID: Amanda English, female   DOB: 05/25/1976, 40 y.o.   MRN: [id number]  Subjective: 40 year old female presents the office they for follow-up with vibration of bilateral heel pain. She states that this is been ongoing I sooner for this previously blistered become more painful over the last couple weeks. She states it is the same pain that she's had previously but somewhat worse. Pain is worse in the morning she first gets up or after periods of sitting. She is on her feet quite a bit with work as well. She has not been stretching or icing or having any treatment recently for this. No recent injury. The toe her last appointment heel and she is having no problems. Denies any systemic complaints such as fevers, chills, nausea, vomiting. No acute changes since last appointment, and no other complaints at this time.   Objective: AAO x3, NAD DP/PT pulses palpable bilaterally, CRT less than 3 seconds Negative tinel sign Tenderness to palpation along the plantar medial tubercle of the calcaneus at the insertion of plantar fascia on the left and right foot. There is no pain along the course of the plantar fascia within the arch of the foot. Plantar fascia appears to be intact. There is no pain with lateral compression of the calcaneus or pain with vibratory sensation. There is mild pain along the  insertion of the achilles tendon on the right. Thompson test negative. No other areas of tenderness to bilateral lower extremities. Equinus No areas of pinpoint bony tenderness or pain with vibratory sensation. MMT 5/5, ROM WNL. No edema, erythema, increase in warmth to bilateral lower extremities.  No open lesions or pre-ulcerative lesions.  No pain with calf compression, swelling, warmth, erythema  Assessment: Recurrent plantar fasciitis bilaterally.  Plan: -All treatment options discussed with the patient including all alternatives, risks, complications.  -Patient elects to proceed with steroid  injection into the left and right heel. Under sterile skin preparation, a total of 2.5cc of kenalog 10, 0.5% Marcaine plain, and 2% lidocaine plain were infiltrated into the symptomatic area without complication. A band-aid was applied. Patient tolerated the injection well without complication. Post-injection care with discussed with the patient. Discussed with the patient to ice the area over the next couple of days to help prevent a steroid flare.  -Night splint -Showed he has plantar fascial braces. Recommended her to start using these again. -Prescribed Medrol Dosepak. Discussed side effects and she understands. -Stretching icing daily. -Discussed orthotics. -Shoegear changes -Follow-up in 2 weeks -Patient encouraged to call the office with any questions, concerns, change in symptoms.   Ovid Curd, DPM

## 2015-12-14 ENCOUNTER — Telehealth: Payer: Self-pay | Admitting: *Deleted

## 2015-12-14 MED ORDER — TRAMADOL HCL 50 MG PO TABS: 50.0000 mg | ORAL_TABLET | Freq: Three times a day (TID) | ORAL | Status: DC | PRN

## 2015-12-14 NOTE — Telephone Encounter (Signed)
Did she take the medrol dose pack? If so continue everything we talked about. Can do tramadol if needed short term.

## 2015-12-14 NOTE — Telephone Encounter (Addendum)
Pt states she is still in pain even after injections about a week ago.  I instructed pt to ice consistently and I asked pt the problems she had with NSAIDS and she said swelling if she takes them for a long time.  I transferred her to schedulers and will call again with orders from Dr. Ardelle Anton.  Left message with Dr. Gabriel Rung orders and informed pt the Tramadol had been called the [facility] in [place].

## 2015-12-18 ENCOUNTER — Ambulatory Visit (INDEPENDENT_AMBULATORY_CARE_PROVIDER_SITE_OTHER): Payer: No Typology Code available for payment source | Admitting: Podiatry

## 2015-12-18 ENCOUNTER — Encounter: Payer: Self-pay | Admitting: Podiatry

## 2015-12-18 VITALS — BP 121/72 | HR 82 | Resp 18

## 2015-12-18 DIAGNOSIS — M7662 Achilles tendinitis, left leg: Secondary | ICD-10-CM

## 2015-12-18 DIAGNOSIS — M7661 Achilles tendinitis, right leg: Secondary | ICD-10-CM

## 2015-12-18 DIAGNOSIS — M722 Plantar fascial fibromatosis: Secondary | ICD-10-CM | POA: Diagnosis not present

## 2015-12-18 NOTE — Progress Notes (Signed)
Patient ID: Amanda English, female   DOB: 12-15-1975, 40 y.o.   MRN: [id number]  Subjective: 40 year old female presents the office in follow-up with vibration of bilateral heel pain. She said the injection did help however they do not last issues are to have recurrence of pain. She says that she has pain still in the morning and she first gets up after sitting and working all day as well. Denies any numbness or tingling. She is also to get more pain and back of her heel as well. No increase in swelling or redness. No tenderness. The pain does not wake her up at night. Denies any systemic complaints such as fevers, chills, nausea, vomiting. No acute changes since last appointment, and no other complaints at this time.   Objective: AAO x3, NAD DP/PT pulses palpable bilaterally, CRT less than 3 seconds This continuation tenderness of plantar medial tubercle of the calcaneus at the insertion of the plantar fascia. There is no pain on the course of plantar fascial in the arch of the foot. There is mild discomfort on the posterior portion calcaneus insertion of the Achilles tendon. There is no defect noted within the tendon [place] test is negative. There is no pain with meals lateral compression of calcaneus. No other areas of tenderness bilaterally No areas of pinpoint bony tenderness or pain with vibratory sensation. MMT 5/5, ROM WNL. No significant equinus.  No edema, erythema, increase in warmth to bilateral lower extremities.  No open lesions or pre-ulcerative lesions.  No pain with calf compression, swelling, warmth, erythema  Assessment: Ongoing bilateral heel pain with some mild improvement in symptoms however does continue.  Plan: -All treatment options discussed with the patient including all alternatives, risks, complications.  -Patient elects to proceed with steroid injection into the left and right heel. Under sterile skin preparation, a total of 2.5cc of kenalog 10, 0.5% Marcaine plain,  and 2% lidocaine plain were infiltrated into the symptomatic area without complication. A band-aid was applied. Patient tolerated the injection well without complication. Post-injection care with discussed with the patient. Discussed with the patient to ice the area over the next couple of days to help prevent a steroid flare.  -I discussed the custom orthotics. Her insurance before she does not cover them at this point. She will get an over-the-counter pair until her next appointment with me. -Continue stretching, icing. Continue supportive shoe gear. -I discussed other treatment options with her including physical therapy, EPAT, surgery. She did to hold off on this options at this point. -Follow-up as scheduled or sooner if needed. -Patient encouraged to call the office with any questions, concerns, change in symptoms.   Ovid Curd, DPM

## 2015-12-25 ENCOUNTER — Ambulatory Visit: Payer: No Typology Code available for payment source | Admitting: Podiatry

## 2016-01-01 ENCOUNTER — Ambulatory Visit: Payer: No Typology Code available for payment source | Admitting: Podiatry

## 2016-01-17 ENCOUNTER — Ambulatory Visit (INDEPENDENT_AMBULATORY_CARE_PROVIDER_SITE_OTHER): Payer: Commercial Managed Care - HMO | Admitting: Podiatry

## 2016-01-17 ENCOUNTER — Encounter: Payer: Self-pay | Admitting: Podiatry

## 2016-01-17 DIAGNOSIS — M7661 Achilles tendinitis, right leg: Secondary | ICD-10-CM

## 2016-01-17 DIAGNOSIS — M792 Neuralgia and neuritis, unspecified: Secondary | ICD-10-CM | POA: Diagnosis not present

## 2016-01-17 DIAGNOSIS — M7662 Achilles tendinitis, left leg: Secondary | ICD-10-CM | POA: Diagnosis not present

## 2016-01-17 DIAGNOSIS — M722 Plantar fascial fibromatosis: Secondary | ICD-10-CM

## 2016-01-17 MED ORDER — TRAMADOL HCL 50 MG PO TABS: 50.0000 mg | ORAL_TABLET | Freq: Three times a day (TID) | ORAL | Status: DC | PRN

## 2016-01-17 MED ORDER — PREGABALIN 75 MG PO CAPS: 75.0000 mg | ORAL_CAPSULE | Freq: Two times a day (BID) | ORAL | Status: DC

## 2016-01-18 ENCOUNTER — Telehealth: Payer: Self-pay | Admitting: *Deleted

## 2016-01-18 NOTE — Progress Notes (Signed)
Patient ID: Evalette Montrose, female   DOB: 1976-02-10, 40 y.o.   MRN: [id number]  Subjective: 40 year old female presents the office today for concerns of ongoing heel pain. She states the heel pain is getting worse and she is unable to walk because of the pain. She's injections did not help. She has continue stretching and icing. She denies any recent injury or trauma. She states that the pain is around the rim of her heel as well. She feels that she is walking on glass at times. Denies any systemic complaints such as fevers, chills, nausea, vomiting. No acute changes since last appointment, and no other complaints at this time.   Objective: AAO x3, NAD DP/PT pulses palpable bilaterally, CRT less than 3 seconds Protective sensation intact with Simms Weinstein monofilament, negative Tinel sign This continuation tenderness of plantar medial tubercle of the calcaneus at insertion the plantar fascia. There is no pain within the arch of the foot. There is also discomfort on the course the Achilles tendon or along the insertion of the calcaneus. Thompson test is negative. There is no pain with lateral compression of the calcaneus. There is no other areas of tenderness bilaterally. Equinus is present. No edema, erythema, increase in warmth to bilateral lower extremities.  No open lesions or pre-ulcerative lesions.  No pain with calf compression, swelling, warmth, erythema  Assessment: Bilateral heel pain, plantar fasciitis/Achilles tendinitis however cannot run neurological in origin  Plan: -All treatment options discussed with the patient including all alternatives, risks, complications.  -At this time discussed Celestone injection. Discussed risks, complications of repeat injection should she understands. Under sterile conditions a mixture of Celestone Soluspan as well as local anesthetic was infiltrated into the area of maximal tenderness to the plantar heel without complications. Post injection care  was discussed. -Plantar fascial tapings were applied -Will start Lyrica. Discussed side effects. We'll do this on a trial basis.  -Also start physical therapy. Prescription provided today. -Discussed EPAT at next appointment should symptoms continue or not improve  -Again discussed CMO -Follow-up 2 weeks or sooner if needed.  -Patient encouraged to call the office with any questions, concerns, change in symptoms.   Ovid Curd, DPM

## 2016-01-18 NOTE — Telephone Encounter (Signed)
Pt states her Lyrica needs a doctor's override.  I asked her if she meant a prior authorization for the Lyrica, and pt said yes.  I told pt that once the pharmacy sent the request for the PA our PA specialist will process and once PA received it will be sent to the Pharmacy and they will call her.

## 2016-01-21 ENCOUNTER — Telehealth: Payer: Self-pay

## 2016-01-21 NOTE — Telephone Encounter (Signed)
PA for Lyrica  received [id number],UJW to patients pharmacy [facility] [phone]

## 2016-01-24 NOTE — Telephone Encounter (Signed)
Lyrica was authorized.  I faxed it to the pharmacy.

## 2016-01-28 ENCOUNTER — Telehealth: Payer: Self-pay | Admitting: *Deleted

## 2016-01-28 NOTE — Telephone Encounter (Addendum)
[employer] COVERAGE HAS BEEN APPROVED UNTIL 01/17/2017, FILE ID: [id number]. 05/21/2016-Pt states she has an appt 06/03/2016, but her medication will run out prior to the appt. Left message requesting pt call with the name of the medication she is wanting refilled. Pt states she would like the Lyrica refilled. Orders and previous prior authorization number called to [facility] [id number].

## 2016-02-01 ENCOUNTER — Ambulatory Visit: Payer: Commercial Managed Care - HMO | Admitting: Podiatry

## 2016-02-05 ENCOUNTER — Ambulatory Visit: Payer: Commercial Managed Care - HMO | Admitting: Podiatry

## 2016-02-12 ENCOUNTER — Ambulatory Visit: Payer: Commercial Managed Care - HMO | Admitting: Podiatry

## 2016-04-03 ENCOUNTER — Encounter (HOSPITAL_COMMUNITY): Payer: Self-pay

## 2016-04-21 ENCOUNTER — Encounter: Payer: Self-pay | Admitting: Psychiatry

## 2016-04-21 ENCOUNTER — Ambulatory Visit (INDEPENDENT_AMBULATORY_CARE_PROVIDER_SITE_OTHER): Payer: 59 | Admitting: Psychiatry

## 2016-04-21 VITALS — BP 125/90 | HR 81 | Temp 98.7°F | Wt 262.2 lb

## 2016-04-21 DIAGNOSIS — F324 Major depressive disorder, single episode, in partial remission: Secondary | ICD-10-CM | POA: Diagnosis not present

## 2016-04-21 DIAGNOSIS — F411 Generalized anxiety disorder: Secondary | ICD-10-CM

## 2016-04-21 MED ORDER — TRAZODONE HCL 50 MG PO TABS: 50.0000 mg | ORAL_TABLET | Freq: Every day | ORAL | 1 refills | Status: DC

## 2016-04-21 MED ORDER — VENLAFAXINE HCL ER 75 MG PO CP24: 75.0000 mg | ORAL_CAPSULE | Freq: Every day | ORAL | 1 refills | Status: DC

## 2016-04-21 NOTE — Progress Notes (Signed)
Psychiatric Initial Adult Assessment   Patient Identification: Amanda English MRN:  [id number] Date of Evaluation:  04/21/2016 Referral Source: self Chief Complaint:   Visit Diagnosis:    ICD-9-CM ICD-10-CM   1. Generalized anxiety disorder 300.02 F41.1   2. 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 She has been seeing her primary care physician and was referred to a psychiatrist  for further care. She reports having anxiety and depression for more than 10 years and has been taking Effexor at 37.5 mg and takes Xanax as needed. States that she was referred here to be able to wean off the Xanax. Currently she reports she takes 0.5 mg as needed most days. States that she has several stressors in her life currently. 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 States that her father is 77 has lack last stage Alzheimer's. Reports her 29 year old daughter has very severe anxiety and is being evaluated currently. Patient reports that she is managing her life but she has days when she is able to do okay in many days that she gets very stressed. States she has not been sleeping well and wakes up multiple times. She denies any suicidal thoughts. She denies any psychotic symptoms. States that she worries a lot about her daughters. States that she has not had time to take care of herself. She denies problems with alcohol or other substance abuse. She has never been hospitalized psychiatrically. Patient has a strong family history of psychiatric illness. Her mother has bipolar disorder. Her maternal grandfather shot himself and died. Patient states that her anxiety started after she had her second child. 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 States that it was a traumatic time for her.  Associated Signs/Symptoms: Depression Symptoms:  denies (Hypo) Manic Symptoms:  denies Anxiety Symptoms:  Worries too much Psychotic Symptoms:  denies PTSD Symptoms: denies  Past Psychiatric History: none  Previous Psychotropic Medications: Yes   Substance Abuse History in the last 12 months:  No.  Consequences of Substance Abuse: Negative  Past Medical History:  Past Medical History:  Diagnosis Date  . Arthritis   . Chiari malformation   . Diabetes mellitus without complication (HCC)   . High cholesterol   . Mitral valve regurgitation     Past Surgical History:  Procedure Laterality Date  . ABDOMINAL HYSTERECTOMY    . APPENDECTOMY    . CESAREAN SECTION    . NOSE SURGERY    . STERNAL WIRE REMOVAL      Family Psychiatric History: Mother has bipolar disorder, is on medications. Uncle has extreme anxiety. Maternal grandfather killed himself by a gun.  Family History: No family history on file.  Social History:   Social History   Social History  . Marital status: Married    Spouse name: N/A  . Number of children: N/A  . Years of education: N/A   Social History Main Topics  . Smoking status: Current Every Day Smoker  . Smokeless tobacco: Not on file  . Alcohol use No  . Drug use: No  . Sexual activity: Not on file   Other Topics Concern  . Not on file  Social History Narrative  . No narrative on file    Additional Social History: Married with 2 children, aged 77 and 42.  Allergies:   Allergies  Allergen Reactions  . Nsaids Swelling    Metabolic Disorder Labs: No results found for: HGBA1C, MPG No results found for: PROLACTIN No results found for: CHOL, TRIG, HDL, CHOLHDL, VLDL, LDLCALC   Current Medications: Current Outpatient Prescriptions  Medication Sig Dispense Refill  . ALPRAZolam (XANAX) 1 MG tablet Take 1 mg by mouth.    . cetirizine  (ZYRTEC) 10 MG tablet Take 10 mg by mouth.    . chlorpheniramine-HYDROcodone (TUSSIONEX) 10-8 MG/5ML LQCR Take by mouth.    . estradiol (VIVELLE-DOT) 0.05 MG/24HR patch Place onto the skin.    Marland Kitchen ketorolac (TORADOL) 10 MG tablet   0  . Ketorolac Tromethamine (SPRIX) 15.75 MG/SPRAY SOLN Place into the nose.    Marland Kitchen Ketorolac Tromethamine 15.75 MG/SPRAY SOLN Ketorolac (Sprix) nasal spray 15.75 mg/spray in each nostril every 6-8 hours for pain. 1 box    . methylPREDNISolone (MEDROL DOSEPAK) 4 MG TBPK tablet Take as directed 21 tablet 0  . ondansetron (ZOFRAN) 8 MG tablet   0  . pregabalin (LYRICA) 75 MG capsule Take 1 capsule (75 mg total) by mouth 2 (two) times daily. 60 capsule 1  . topiramate (TOPAMAX) 100 MG tablet Take 100 mg by mouth.    . traMADol (ULTRAM) 50 MG tablet Take 1 tablet (50 mg total) by mouth every 8 (eight) hours as needed. 30 tablet 0  . traZODone (DESYREL) 50 MG tablet Take 1 tablet (50 mg total) by mouth at bedtime. 30 tablet 1  . venlafaxine XR (EFFEXOR-XR) 75 MG 24 hr capsule Take 1 capsule (75 mg total) by mouth daily with breakfast. 30 capsule 1   Current Facility-Administered Medications  Medication Dose Route Frequency Provider Last Rate Last Dose  . triamcinolone acetonide (KENALOG) 10 MG/ML injection 10 mg  10 mg Other Once Vivi Barrack, DPM      . triamcinolone acetonide (KENALOG) 10 MG/ML injection 10 mg  10 mg Other Once Vivi Barrack, DPM        Neurologic: Headache: migraines Seizure: No Paresthesias:No  Musculoskeletal: Strength & Muscle Tone: within normal limits Gait & Station: normal Patient leans: N/A  Psychiatric Specialty Exam: ROS  There were no vitals taken for this visit.There is no height or weight on file to calculate BMI.  General Appearance: Casual  Eye Contact:  Fair  Speech:  Clear and Coherent  Volume:  Normal  Mood:  Anxious  Affect:  Appropriate  Thought Process:  Coherent  Orientation:  Full (Time, Place, and Person)   Thought Content:  WDL  Suicidal Thoughts:  No  Homicidal Thoughts:  No  Memory:  Immediate;   Fair Recent;   Fair Remote;   Fair  Judgement:  Fair  Insight:  Fair  Psychomotor Activity:  Normal  Concentration:  Concentration: Fair and Attention Span: Fair  Recall:  [employer] of Knowledge:Fair  Language: Fair  Akathisia:  No  Handed:  Right  AIMS (if indicated):  na  Assets:  Communication Skills Desire for Improvement Financial Resources/Insurance Housing Social Support Vocational/Educational  ADL's:  Intact  Cognition: WNL  Sleep:  Wake up several times in a night    Treatment Plan Summary:  Generalized anxiety disorder Increase Effexor to them at 75 mg daily. Decrease Xanax 0.5 mg to 2-3 times a week as needed. Start seeing a therapist  Depression  Same as above  Insomnia Start trazodone at 50 mg at bedtime  Return to clinic in 1 month's time or call before if needed     Patrick North, MD 10/2/20172:44 PM

## 2016-04-23 ENCOUNTER — Other Ambulatory Visit: Payer: Self-pay | Admitting: Podiatry

## 2016-05-21 MED ORDER — PREGABALIN 75 MG PO CAPS: 75.0000 mg | ORAL_CAPSULE | Freq: Two times a day (BID) | ORAL | 0 refills | Status: DC

## 2016-05-26 ENCOUNTER — Ambulatory Visit: Payer: Self-pay | Admitting: Psychiatry

## 2016-06-02 ENCOUNTER — Encounter: Payer: Self-pay | Admitting: *Deleted

## 2016-06-03 ENCOUNTER — Ambulatory Visit: Payer: Commercial Managed Care - HMO | Admitting: General Surgery

## 2016-06-03 ENCOUNTER — Ambulatory Visit: Payer: Commercial Managed Care - HMO | Admitting: Podiatry

## 2016-06-03 ENCOUNTER — Ambulatory Visit: Payer: 59 | Admitting: Psychiatry

## 2016-06-05 ENCOUNTER — Ambulatory Visit: Payer: 59 | Admitting: Psychiatry

## 2016-06-23 ENCOUNTER — Ambulatory Visit (INDEPENDENT_AMBULATORY_CARE_PROVIDER_SITE_OTHER): Payer: 59 | Admitting: Psychiatry

## 2016-06-23 ENCOUNTER — Encounter: Payer: Self-pay | Admitting: Psychiatry

## 2016-06-23 VITALS — BP 129/84 | HR 80 | Temp 97.7°F | Wt 287.8 lb

## 2016-06-23 DIAGNOSIS — F411 Generalized anxiety disorder: Secondary | ICD-10-CM

## 2016-06-23 DIAGNOSIS — F324 Major depressive disorder, single episode, in partial remission: Secondary | ICD-10-CM

## 2016-06-23 MED ORDER — VENLAFAXINE HCL ER 150 MG PO CP24: 150.0000 mg | ORAL_CAPSULE | Freq: Every day | ORAL | 1 refills | Status: DC

## 2016-06-23 MED ORDER — TRAZODONE HCL 50 MG PO TABS: 50.0000 mg | ORAL_TABLET | Freq: Every day | ORAL | 1 refills | Status: DC

## 2016-06-23 NOTE — Progress Notes (Signed)
Psychiatric progress note    Patient Identification: Amanda English  MRN:  [id number] Date of Evaluation:  06/23/2016 Referral Source: self Chief Complaint:   Visit Diagnosis:    ICD-9-CM ICD-10-CM   1. Generalized anxiety disorder 300.02 F41.1   2. 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 States that since increasing the Effexor to 75 mg she has not noticed any difference in her mood. She reports increased anxiety and states that she has several stressors at work and home. States that she has a teenage daughter who is needing therapy and the she is also stressed at work. She however denies any suicidal thoughts. Reports that she is doing Better in terms of sleeping on the trazodone. She denies any suicidal thoughts.She was able to wean off the Xanax without any problems but just reports increased anxiety.  Past Psychiatric History: none  Previous Psychotropic Medications: Yes   Substance Abuse History in the last 12 months:  No.  Consequences of Substance Abuse: Negative  Past Medical History:  Past Medical History:  Diagnosis Date  . Anxiety   . Arthritis   . Chiari malformation   . Depression   . Diabetes mellitus without complication (HCC)   . Diabetes mellitus, type II (HCC)   . High cholesterol   . Mitral valve regurgitation     Past Surgical History:  Procedure Laterality Date  . ABDOMINAL HYSTERECTOMY    . APPENDECTOMY    . CESAREAN SECTION    . NOSE SURGERY    . STERNAL WIRE REMOVAL      Family Psychiatric History: Mother has bipolar disorder, is on medications. Uncle has extreme anxiety. Maternal grandfather killed himself by a gun.  Family History:  Family History  Problem Relation Age of Onset  . Depression Mother   .  Bipolar disorder Mother   . Alzheimer's disease Father   . Thyroid disease Brother     Social History:   Social History   Social History  . Marital status: Married    Spouse name: N/A  . Number of children: N/A  . Years of education: N/A   Social History Main Topics  . Smoking status: Current Every Day Smoker    Packs/day: 0.50    Types: Cigarettes  . Smokeless tobacco: Never Used  . Alcohol use No  . Drug use: No  . Sexual activity: Yes   Other Topics Concern  . Not on file   Social History Narrative  . No narrative on file    Additional Social History: Married with 2 children, aged 68 and 8.  Allergies:   Allergies  Allergen Reactions  . Nsaids Swelling    Metabolic Disorder Labs: No results found for: HGBA1C, MPG No results found for: PROLACTIN No results found for: CHOL, TRIG, HDL, CHOLHDL, VLDL, LDLCALC   Current Medications: Current Outpatient Prescriptions  Medication Sig Dispense Refill  . ALPRAZolam (XANAX) 1 MG tablet Take 1 mg by mouth.    Marland Kitchen azithromycin (ZITHROMAX) 250 MG tablet take 2 tablets by mouth on day 1 then 1 tablet on days 2 through 5  0  . cetirizine (ZYRTEC) 10 MG tablet Take 10 mg by mouth.    . estradiol (VIVELLE-DOT) 0.05 MG/24HR patch Place onto the skin.    . fenofibrate 160 MG tablet take 1 tablet by mouth every evening  for cholesterol    . fluconazole (DIFLUCAN) 150 MG tablet take 1 tablet by mouth as a single dose may repeat IN 3 days  0  . ketorolac (TORADOL) 10 MG tablet   0  . Ketorolac Tromethamine (SPRIX) 15.75 MG/SPRAY SOLN Place into the nose.    Marland Kitchen Ketorolac Tromethamine 15.75 MG/SPRAY SOLN Ketorolac (Sprix) nasal spray 15.75 mg/spray in each nostril every 6-8 hours for pain. 1 box    . lisinopril (PRINIVIL,ZESTRIL) 10 MG tablet Take 10 mg by mouth every morning.  0  . LYRICA 75 MG capsule take 1 capsule by mouth twice a day 60 capsule 1  . metFORMIN (GLUCOPHAGE) 500 MG tablet Take by mouth.    . methylPREDNISolone  (MEDROL DOSEPAK) 4 MG TBPK tablet Take as directed 21 tablet 0  . metoprolol succinate (TOPROL-XL) 50 MG 24 hr tablet Take by mouth.    Marland Kitchen omeprazole (PRILOSEC) 40 MG capsule   0  . ondansetron (ZOFRAN) 8 MG tablet   0  . pregabalin (LYRICA) 75 MG capsule Take 1 capsule (75 mg total) by mouth 2 (two) times daily. 60 capsule 0  . topiramate (TOPAMAX) 100 MG tablet Take 100 mg by mouth.    . traMADol (ULTRAM) 50 MG tablet Take 1 tablet (50 mg total) by mouth every 8 (eight) hours as needed. 30 tablet 0  . traZODone (DESYREL) 50 MG tablet Take 1 tablet (50 mg total) by mouth at bedtime. 30 tablet 1  . venlafaxine XR (EFFEXOR-XR) 75 MG 24 hr capsule Take 1 capsule (75 mg total) by mouth daily with breakfast. 30 capsule 1   Current Facility-Administered Medications  Medication Dose Route Frequency Provider Last Rate Last Dose  . triamcinolone acetonide (KENALOG) 10 MG/ML injection 10 mg  10 mg Other Once Vivi Barrack, DPM      . triamcinolone acetonide (KENALOG) 10 MG/ML injection 10 mg  10 mg Other Once Vivi Barrack, DPM        Neurologic: Headache: migraines Seizure: No Paresthesias:No  Musculoskeletal: Strength & Muscle Tone: within normal limits Gait & Station: normal Patient leans: N/A  Psychiatric Specialty Exam: ROS  There were no vitals taken for this visit.There is no height or weight on file to calculate BMI.  General Appearance: Casual  Eye Contact:  Fair  Speech:  Clear and Coherent  Volume:  Normal  Mood:  Anxious  Affect:  Appropriate  Thought Process:  Coherent  Orientation:  Full (Time, Place, and Person)  Thought Content:  WDL  Suicidal Thoughts:  No  Homicidal Thoughts:  No  Memory:  Immediate;   Fair Recent;   Fair Remote;   Fair  Judgement:  Fair  Insight:  Fair  Psychomotor Activity:  Normal  Concentration:  Concentration: Fair and Attention Span: Fair  Recall:  [employer] of Knowledge:Fair  Language: Fair  Akathisia:  No  Handed:  Right   AIMS (if indicated):  na  Assets:  Communication Skills Desire for Improvement Financial Resources/Insurance Housing Social Support Vocational/Educational  ADL's:  Intact  Cognition: WNL  Sleep:  sleeping better on the trazodone    Treatment Plan Summary:  Generalized anxiety disorder Increase Effexor to 150 mg po qd Start seeing a therapist but patient reports hat she cannot afford a therapist currently.  Depression Same as above  Insomnia Continue trazodone at 50 mg at bedtime  Return to clinic in 1 month's time or call before if needed     Patrick North, MD 12/4/201711:13 AM

## 2016-06-24 ENCOUNTER — Ambulatory Visit: Payer: Commercial Managed Care - HMO | Admitting: General Surgery

## 2016-06-26 ENCOUNTER — Ambulatory Visit: Payer: Commercial Managed Care - HMO | Admitting: Podiatry

## 2016-07-29 ENCOUNTER — Ambulatory Visit: Payer: Commercial Managed Care - HMO | Admitting: General Surgery

## 2016-07-31 ENCOUNTER — Encounter: Payer: Self-pay | Admitting: *Deleted

## 2016-08-11 ENCOUNTER — Telehealth: Payer: Self-pay

## 2016-08-11 NOTE — Telephone Encounter (Signed)
pt called again if anything can be called in.  can you please review sent to [facility] but no one answered.    Pt states that her father is in hospice and is not expected to live much longer she having a hard time and wanted to know if a few weeks of something to calm her nervousness.

## 2016-08-11 NOTE — Telephone Encounter (Signed)
pt called answering services on  08-09-2015 at 11:17am left message that she is having anxiety attachs after her father had to go into hospica ans has a days to live. needs something to help her go thur this.

## 2016-08-12 ENCOUNTER — Other Ambulatory Visit: Payer: Self-pay | Admitting: Psychiatry

## 2016-08-12 MED ORDER — ALPRAZOLAM 0.25 MG PO TABS: 0.2500 mg | ORAL_TABLET | Freq: Two times a day (BID) | ORAL | 0 refills | Status: DC | PRN

## 2016-08-12 NOTE — Progress Notes (Signed)
Returned call. Voicemail. Looks like the plan had been to wean this patient off of benzodiazepines. I left a message for the patient to return a call to our office if she still needs help.

## 2016-08-12 NOTE — Progress Notes (Signed)
Patient has called multiple times. Very high stress level related to her father's illness. Feels very desperate. Once a short-term return of alprazolam. Prescription phoned in for alprazolam 0.25 mg twice a day as needed for anxiety for just 2 weeks. She will follow-up with Dr. Daleen Bo

## 2016-08-23 ENCOUNTER — Other Ambulatory Visit: Payer: Self-pay | Admitting: Psychiatry

## 2016-08-25 ENCOUNTER — Ambulatory Visit: Payer: 59 | Admitting: Psychiatry

## 2016-08-25 ENCOUNTER — Emergency Department: Payer: Commercial Managed Care - HMO

## 2016-08-25 ENCOUNTER — Emergency Department
Admission: EM | Admit: 2016-08-25 | Discharge: 2016-08-25 | Disposition: A | Discharge status: Home / Self Care | Payer: Commercial Managed Care - HMO | Attending: Emergency Medicine | Admitting: Emergency Medicine

## 2016-08-25 DIAGNOSIS — Z79899 Other long term (current) drug therapy: Secondary | ICD-10-CM | POA: Insufficient documentation

## 2016-08-25 DIAGNOSIS — F17210 Nicotine dependence, cigarettes, uncomplicated: Secondary | ICD-10-CM | POA: Diagnosis not present

## 2016-08-25 DIAGNOSIS — Z791 Long term (current) use of non-steroidal anti-inflammatories (NSAID): Secondary | ICD-10-CM | POA: Diagnosis not present

## 2016-08-25 DIAGNOSIS — R109 Unspecified abdominal pain: Secondary | ICD-10-CM

## 2016-08-25 DIAGNOSIS — I10 Essential (primary) hypertension: Secondary | ICD-10-CM | POA: Insufficient documentation

## 2016-08-25 DIAGNOSIS — Z7984 Long term (current) use of oral hypoglycemic drugs: Secondary | ICD-10-CM | POA: Diagnosis not present

## 2016-08-25 DIAGNOSIS — R0789 Other chest pain: Secondary | ICD-10-CM | POA: Insufficient documentation

## 2016-08-25 DIAGNOSIS — E119 Type 2 diabetes mellitus without complications: Secondary | ICD-10-CM | POA: Diagnosis not present

## 2016-08-25 DIAGNOSIS — R1011 Right upper quadrant pain: Secondary | ICD-10-CM | POA: Diagnosis not present

## 2016-08-25 DIAGNOSIS — R079 Chest pain, unspecified: Secondary | ICD-10-CM

## 2016-08-25 HISTORY — DX: Essential (primary) hypertension: I10

## 2016-08-25 LAB — LIPASE, BLOOD: Lipase: 23 U/L (ref 11–51)

## 2016-08-25 LAB — HEPATIC FUNCTION PANEL
ALK PHOS: 109 U/L (ref 38–126)
ALT: 31 U/L (ref 14–54)
AST: 23 U/L (ref 15–41)
Albumin: 4.2 g/dL (ref 3.5–5.0)
BILIRUBIN TOTAL: 0.6 mg/dL (ref 0.3–1.2)
Total Protein: 7.7 g/dL (ref 6.5–8.1)

## 2016-08-25 LAB — CBC
HEMATOCRIT: 38.3 % (ref 35.0–47.0)
HEMOGLOBIN: 13 g/dL (ref 12.0–16.0)
MCH: 30.6 pg (ref 26.0–34.0)
MCHC: 33.8 g/dL (ref 32.0–36.0)
MCV: 90.5 fL (ref 80.0–100.0)
Platelets: 330 10*3/uL (ref 150–440)
RBC: 4.24 MIL/uL (ref 3.80–5.20)
RDW: 14.3 % (ref 11.5–14.5)
WBC: 13.8 10*3/uL — ABNORMAL HIGH (ref 3.6–11.0)

## 2016-08-25 LAB — BASIC METABOLIC PANEL
ANION GAP: 9 (ref 5–15)
BUN: 9 mg/dL (ref 6–20)
CHLORIDE: 106 mmol/L (ref 101–111)
CO2: 25 mmol/L (ref 22–32)
Calcium: 9.3 mg/dL (ref 8.9–10.3)
Creatinine, Ser: 0.63 mg/dL (ref 0.44–1.00)
GFR calc non Af Amer: >60 mL/min (ref >60)
GLUCOSE: 91 mg/dL (ref 65–99)
Potassium: 3.8 mmol/L (ref 3.5–5.1)
Sodium: 140 mmol/L (ref 135–145)

## 2016-08-25 LAB — TROPONIN I: Troponin I: <0.03 ng/mL (ref <0.03)

## 2016-08-25 LAB — FIBRIN DERIVATIVES D-DIMER (ARMC ONLY): Fibrin derivatives D-dimer (ARMC): 245 (ref 0–499)

## 2016-08-25 MED ORDER — MORPHINE SULFATE (PF) 4 MG/ML IV SOLN
4.0000 mg | Freq: Once | INTRAVENOUS | Status: AC
  Administered 2016-08-25: 4 mg via INTRAVENOUS

## 2016-08-25 MED ORDER — MORPHINE SULFATE (PF) 4 MG/ML IV SOLN
INTRAVENOUS | Status: AC
  Filled 2016-08-25: qty 1

## 2016-08-25 MED ORDER — ONDANSETRON HCL 4 MG/2ML IJ SOLN
4.0000 mg | Freq: Once | INTRAMUSCULAR | Status: AC
  Administered 2016-08-25: 4 mg via INTRAVENOUS

## 2016-08-25 MED ORDER — ONDANSETRON HCL 4 MG/2ML IJ SOLN
INTRAMUSCULAR | Status: AC
  Filled 2016-08-25: qty 2

## 2016-08-25 NOTE — Discharge Instructions (Signed)
You have been seen in the Emergency Department (ED) today for chest pain.  As we have discussed today?s test results are normal, but you may require further testing. ° °Please follow up with the recommended doctor as instructed above in these documents regarding today?s emergent visit and your recent symptoms to discuss further management.  ° °Return to the Emergency Department (ED) if you experience any further chest pain/pressure/tightness, difficulty breathing, or sudden sweating, or other symptoms that concern you. ° °

## 2016-08-25 NOTE — ED Provider Notes (Signed)
[facility] Emergency Department Provider Note   ____________________________________________   First MD Initiated Contact with Patient 08/25/16 1704     (approximate)  I have reviewed the triage vital signs and the nursing notes.   HISTORY  Chief Complaint Chest Pain    HPI Amanda English is a 41 y.o. female here for evaluation of sharp pain in her mid chest.  About 2 hours before she came him or she Department she stood up from her desk at work and began to have a sharp pain underneath her breastbone, that radiates around slightly to the right along her lower rib cage. She reports nausea but no vomiting. No sweats. The pain is sharp, comes and goes and feels like a "muscle spasm" over the right lower ribs. She points N demonstrates an area just right of the costovertebral angle of the anterior chest where she reports she feels a "spasming" feeling in the muscles that radiates around to the right lower rib cage.  She denies abdominal pain. No vomiting. Previous hysterectomy. No pain in the lower abdomen.  Denies any chest tightness or heaviness. She does report previous sternotomy, and resection of her thyroid about 10 years ago but has since had the wires removed. No shortness of breath fevers or chills  Denies any personal history of heart disease. She is a smoker. She does not have an early onset family history of heart disease  Past Medical History:  Diagnosis Date  . Anxiety   . Arthritis   . Chiari malformation   . Depression   . Diabetes mellitus without complication (HCC)   . Diabetes mellitus, type II (HCC)   . High cholesterol   . Hypertension   . Mitral valve regurgitation     Patient Active Problem List   Diagnosis Date Noted  . Plantar fasciitis 12/06/2015  . Achilles tendonitis, bilateral 12/06/2015  . Contusion of bone 08/31/2015  . Loss of memory 07/14/2015  . Chronic tension-type headache, not intractable 06/18/2015  .  Tobacco abuse 04/14/2014  . Headache 01/27/2014  . Memory loss 01/27/2014  . Sleep disorder 01/27/2014  . Arthralgia 09/12/2013  . H/O juvenile rheumatoid arthritis 09/12/2013  . 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 Obesity 09/12/2013  . Personal history of arthritis 09/12/2013  . Bilateral hand pain 08/24/2013  . Fibromyalgia 08/24/2013  . Myalgia and myositis 08/24/2013  . Right ankle swelling 08/24/2013  . Rheumatoid arthritis (HCC) 08/24/2013  . Diffuse connective tissue disease (HCC) 08/24/2013    Past Surgical History:  Procedure Laterality Date  . ABDOMINAL HYSTERECTOMY    . APPENDECTOMY    . CESAREAN SECTION    . NOSE SURGERY    . STERNAL WIRE REMOVAL      Prior to Admission medications   Medication Sig Start Date End Date Taking? Authorizing Provider  ALPRAZolam (XANAX) 0.25 MG tablet Take 1 tablet (0.25 mg total) by mouth 2 (two) times daily as needed for anxiety. 08/12/16   Audery Amel, MD  ALPRAZolam Prudy Feeler) 1 MG tablet Take 1 mg by mouth.    Historical Provider, MD  azithromycin (ZITHROMAX) 250 MG tablet take 2 tablets by mouth on day 1 then 1 tablet on days 2 through 5 02/11/16   Historical Provider, MD  cetirizine (ZYRTEC) 10 MG tablet Take 10 mg by mouth.    Historical Provider, MD  estradiol (VIVELLE-DOT) 0.05 MG/24HR patch Place onto the skin.    Historical Provider, MD  fenofibrate 160 MG tablet take 1 tablet  by mouth every evening for cholesterol 04/17/15   Historical Provider, MD  fluconazole (DIFLUCAN) 150 MG tablet take 1 tablet by mouth as a single dose may repeat IN 3 days 02/12/16   Historical Provider, MD  ketorolac (TORADOL) 10 MG tablet  06/21/14   Historical Provider, MD  Ketorolac Tromethamine (SPRIX) 15.75 MG/SPRAY SOLN Place into the nose.    Historical Provider, MD  Ketorolac Tromethamine 15.75 MG/SPRAY SOLN Ketorolac (Sprix) nasal spray 15.75 mg/spray in each nostril every 6-8 hours for pain. 1 box 04/14/14   Historical Provider, MD    lisinopril (PRINIVIL,ZESTRIL) 10 MG tablet Take 10 mg by mouth every morning. 04/16/16   Historical Provider, MD  LYRICA 75 MG capsule take 1 capsule by mouth twice a day 04/23/16   Vivi Barrack, DPM  metFORMIN (GLUCOPHAGE) 500 MG tablet Take by mouth. 04/17/15   Historical Provider, MD  methylPREDNISolone (MEDROL DOSEPAK) 4 MG TBPK tablet Take as directed 12/06/15   Vivi Barrack, DPM  metoprolol succinate (TOPROL-XL) 50 MG 24 hr tablet Take by mouth. 12/15/14   Historical Provider, MD  omeprazole (PRILOSEC) 40 MG capsule  04/17/16   Historical Provider, MD  ondansetron (ZOFRAN) 8 MG tablet  05/23/14   Historical Provider, MD  pregabalin (LYRICA) 75 MG capsule Take 1 capsule (75 mg total) by mouth 2 (two) times daily. 05/21/16   Vivi Barrack, DPM  topiramate (TOPAMAX) 100 MG tablet Take 100 mg by mouth.    Historical Provider, MD  traMADol (ULTRAM) 50 MG tablet Take 1 tablet (50 mg total) by mouth every 8 (eight) hours as needed. 01/17/16   Vivi Barrack, DPM  traZODone (DESYREL) 50 MG tablet Take 1 tablet (50 mg total) by mouth at bedtime. 06/23/16   Himabindu Ravi, MD  venlafaxine XR (EFFEXOR-XR) 150 MG 24 hr capsule Take 1 capsule (150 mg total) by mouth daily with breakfast. 06/23/16   Patrick North, MD    Allergies Nsaids  Family History  Problem Relation Age of Onset  . Depression Mother   . Bipolar disorder Mother   . Alzheimer's disease Father   . Thyroid disease Brother     Social History Social History  Substance Use Topics  . Smoking status: Current Every Day Smoker    Packs/day: 0.50    Types: Cigarettes  . Smokeless tobacco: Never Used  . Alcohol use No    Review of Systems Constitutional: No fever/chills Eyes: No visual changes. ENT: No sore throat. Cardiovascular: See history of present illness Respiratory: Denies shortness of breath. Gastrointestinal: No abdominal pain.  No nausea, no vomiting.  No diarrhea.  No constipation. Genitourinary:  Negative for dysuria. Musculoskeletal: Negative for back pain. Skin: Negative for rash. Neurological: Negative for headaches, focal weakness or numbness.  No recent long trips or travels. No history of blood clots. Does not take any estrogens. No leg swelling. No blood or cough.  10-point ROS otherwise negative.  ____________________________________________   PHYSICAL EXAM:  VITAL SIGNS: ED Triage Vitals  Enc Vitals Group     BP 08/25/16 1311 (!) 124/47     Pulse Rate 08/25/16 1311 86     Resp 08/25/16 1311 16     Temp 08/25/16 1311 97.6 F (36.4 C)     Temp Source 08/25/16 1311 Oral     SpO2 08/25/16 1311 98 %     Weight 08/25/16 1309 260 lb (117.9 kg)     Height 08/25/16 1309 5\' 3"  (1.6 m)     Head  Circumference --      Peak Flow --      Pain Score 08/25/16 1309 8     Pain Loc --      Pain Edu? --      Excl. in GC? --     Constitutional: Alert and oriented. Well appearing and in no acute distress. Eyes: Conjunctivae are normal. PERRL. EOMI. Head: Atraumatic. Nose: No congestion/rhinnorhea. Mouth/Throat: Mucous membranes are moist.  Oropharynx non-erythematous. Neck: No stridor.   Cardiovascular: Normal rate, regular rhythm. Grossly normal heart sounds.  Good peripheral circulation. Respiratory: Normal respiratory effort.  No retractions. Lungs CTAB. Gastrointestinal: Soft and nontenderExcept reproducible pain in the right upper quadrant, or sure ports spasming feeling along the right ribs with palpation along the right costovertebral angle, and some slight tenderness to Murphy's sign. No pain at McBurney's point. No rebound or guarding anywhere in the abdomen. No left-sided tenderness. No distention. No abdominal bruits. No CVA tenderness. Musculoskeletal: No lower extremity tenderness nor edema.  No joint effusions. Neurologic:  Normal speech and language. No gross focal neurologic deficits are appreciated. Skin:  Skin is warm, dry and intact. No rash  noted. Psychiatric: Mood and affect are normal. Speech and behavior are normal.  ____________________________________________   LABS (all labs ordered are listed, but only abnormal results are displayed)  Labs Reviewed  CBC - Abnormal; Notable for the following:       Result Value   WBC 13.8 (*)    All other components within normal limits  BASIC METABOLIC PANEL  TROPONIN I  HEPATIC FUNCTION PANEL  LIPASE, BLOOD  FIBRIN DERIVATIVES D-DIMER ([facility] ONLY)  TROPONIN I   ____________________________________________  EKG  Reviewed and interpreted by me at 1315 Ventricular rate 80 Sure a 75 QTC 440 Normal sinus rhythm, there is a nonspecific T wave abnormality seen in an inferolateral distribution including a slight biphasic appearance to the T waves in this region. Unfortunately no previous EKGs available for comparison.  Interpreted as normal sinus rhythm with a nonspecific T-wave abnormality ____________________________________________  RADIOLOGY  Dg Chest 2 View  Result Date: 08/25/2016 CLINICAL DATA:  Chest pain for few hours EXAM: CHEST  2 VIEW COMPARISON:  11/12/2014 FINDINGS: Cardiac shadow is within normal limits. Postsurgical changes are seen. The lungs are well aerated bilaterally. No focal infiltrate or sizable effusion is noted. No acute bony abnormality is seen. IMPRESSION: No active cardiopulmonary disease. Electronically Signed   By: Alcide Clever M.D.   On: 08/25/2016 13:37   US Abdomen Limited Ruq  Result Date: 08/25/2016 CLINICAL DATA:  Abdominal pain, right-sided chest pain EXAM: US ABDOMEN LIMITED - RIGHT UPPER QUADRANT COMPARISON:  None. FINDINGS: Gallbladder: No gallstones or wall thickening visualized. No sonographic Murphy sign noted by sonographer. Common bile duct: Diameter: 3.3 mm Liver: No focal lesion identified. Increased hepatic parenchymal echogenicity. IMPRESSION: 1. No cholelithiasis or sonographic evidence of acute cholecystitis. 2. No focal lesion  identified. Increased hepatic parenchymal echogenicity. Electronically Signed   By: Elige Ko   On: 08/25/2016 18:30    ____________________________________________   PROCEDURES  Procedure(s) performed: None  Procedures  Critical Care performed: No  ____________________________________________   INITIAL IMPRESSION / ASSESSMENT AND PLAN / ED COURSE  Pertinent labs & imaging results that were available during my care of the patient were reviewed by me and considered in my medical decision making (see chart for details).  Patient returns for evaluation of atypical chest pain. Apparently right-sided and reproducible. Her EKG though does demonstrate some mild T-wave  abnormality, and I discussed this with her cardiologist Dr. Park Breed. He knows the patient well, and advises she is very low risk and was too negative troponins that he would advise discharge and can follow her up tomorrow at 9:30 in the morning. I think this is very reasonable. Ultrasound demonstrates no acute findings in the right upper quadrant, patient afebrile reassuring lab tests with 2 negative troponins and a normal d-dimer. Patient is at high risk for pulmonary embolism, dissection, or other acute chest anomaly at this time  Clinical Course as of Aug 26 1907  Mon Aug 25, 2016  [id number] Discussed with Dr. Welton Flakes. Advises ok for DC and will see in follow-up tomorrow at 930AM in his clinic. Patient agreesable with this plan and careful return precautions. Husband driving her home.  [MQ]    Clinical Course User Index [MQ] Sharyn Creamer, MD   Patient is low risk by well's, negative d-dimer.  ____________________________________________   FINAL CLINICAL IMPRESSION(S) / ED DIAGNOSES  Final diagnoses:  Abdominal pain  Right-sided chest pain      NEW MEDICATIONS STARTED DURING THIS VISIT:  New Prescriptions   No medications on file     Note:  This document was prepared using Dragon voice recognition software and may  include unintentional dictation errors.     Sharyn Creamer, MD 08/25/16 [phone]

## 2016-08-25 NOTE — ED Triage Notes (Signed)
Pt reports she started with chest pain approx 2 hours ago - pain is centralized in chest and radiates around the right side into her back - pt reports lightheadedness and nausea but denies vomiting - denies shortness of breath - skin color race appropriate - respirations even and unlabored

## 2016-08-25 NOTE — ED Notes (Signed)
Pt alert and oriented X4, active, cooperative, pt in NAD. RR even and unlabored, color WNL.  Pt informed to return if any life threatening symptoms occur.   

## 2016-09-02 ENCOUNTER — Other Ambulatory Visit: Payer: Self-pay | Admitting: Podiatry

## 2016-09-02 NOTE — Telephone Encounter (Signed)
Pt needs an appt

## 2016-10-06 ENCOUNTER — Other Ambulatory Visit: Payer: Self-pay | Admitting: Nurse Practitioner

## 2016-10-06 DIAGNOSIS — Z1231 Encounter for screening mammogram for malignant neoplasm of breast: Secondary | ICD-10-CM

## 2016-11-12 ENCOUNTER — Inpatient Hospital Stay: Admission: RE | Admit: 2016-11-12 | Payer: Self-pay | Source: Ambulatory Visit

## 2017-07-02 IMAGING — CR DG CHEST 2V
1 series · 2 of 2 positions shown · non-contrast
Comparison: 11/12/2014

CLINICAL DATA: Chest pain for few hours

EXAM:
CHEST  2 VIEW

[Series 1: dg chest 2 view · 0.14mm/px · 2 of 2 slices shown]
[im 1/2]
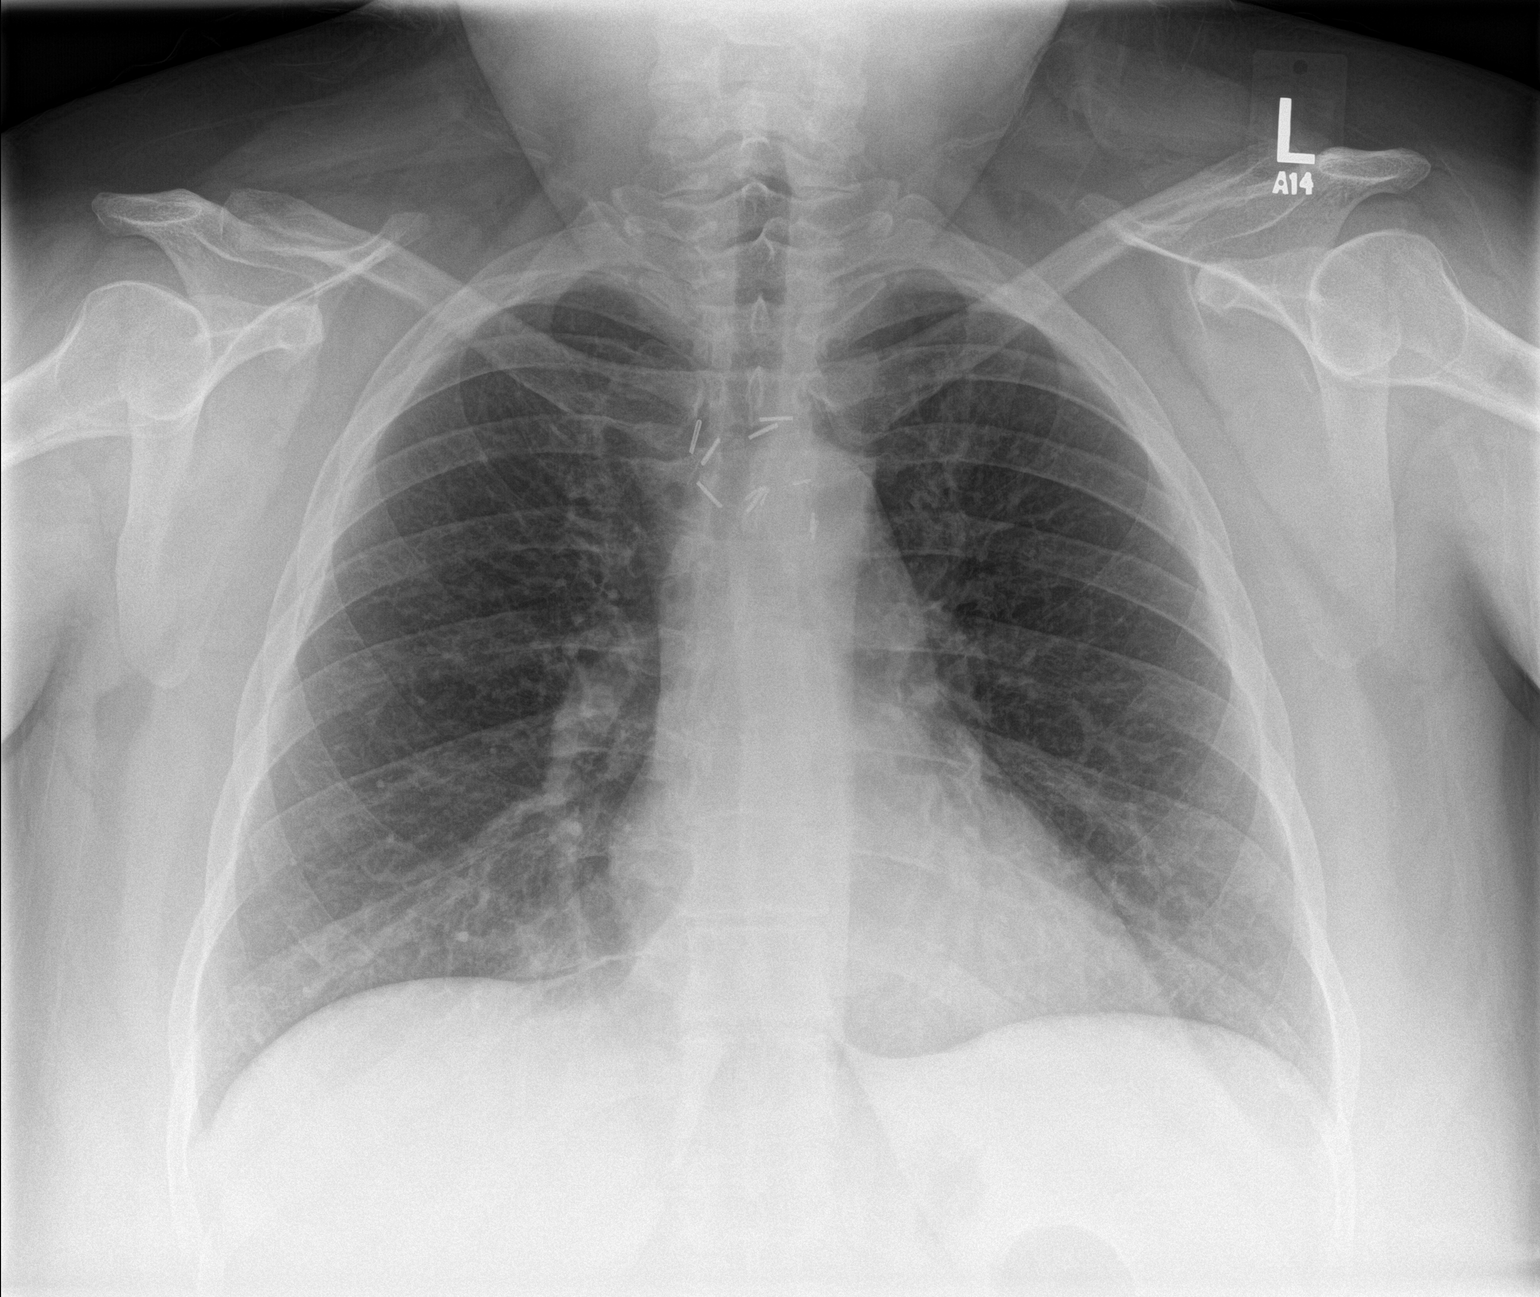
[im 2/2]
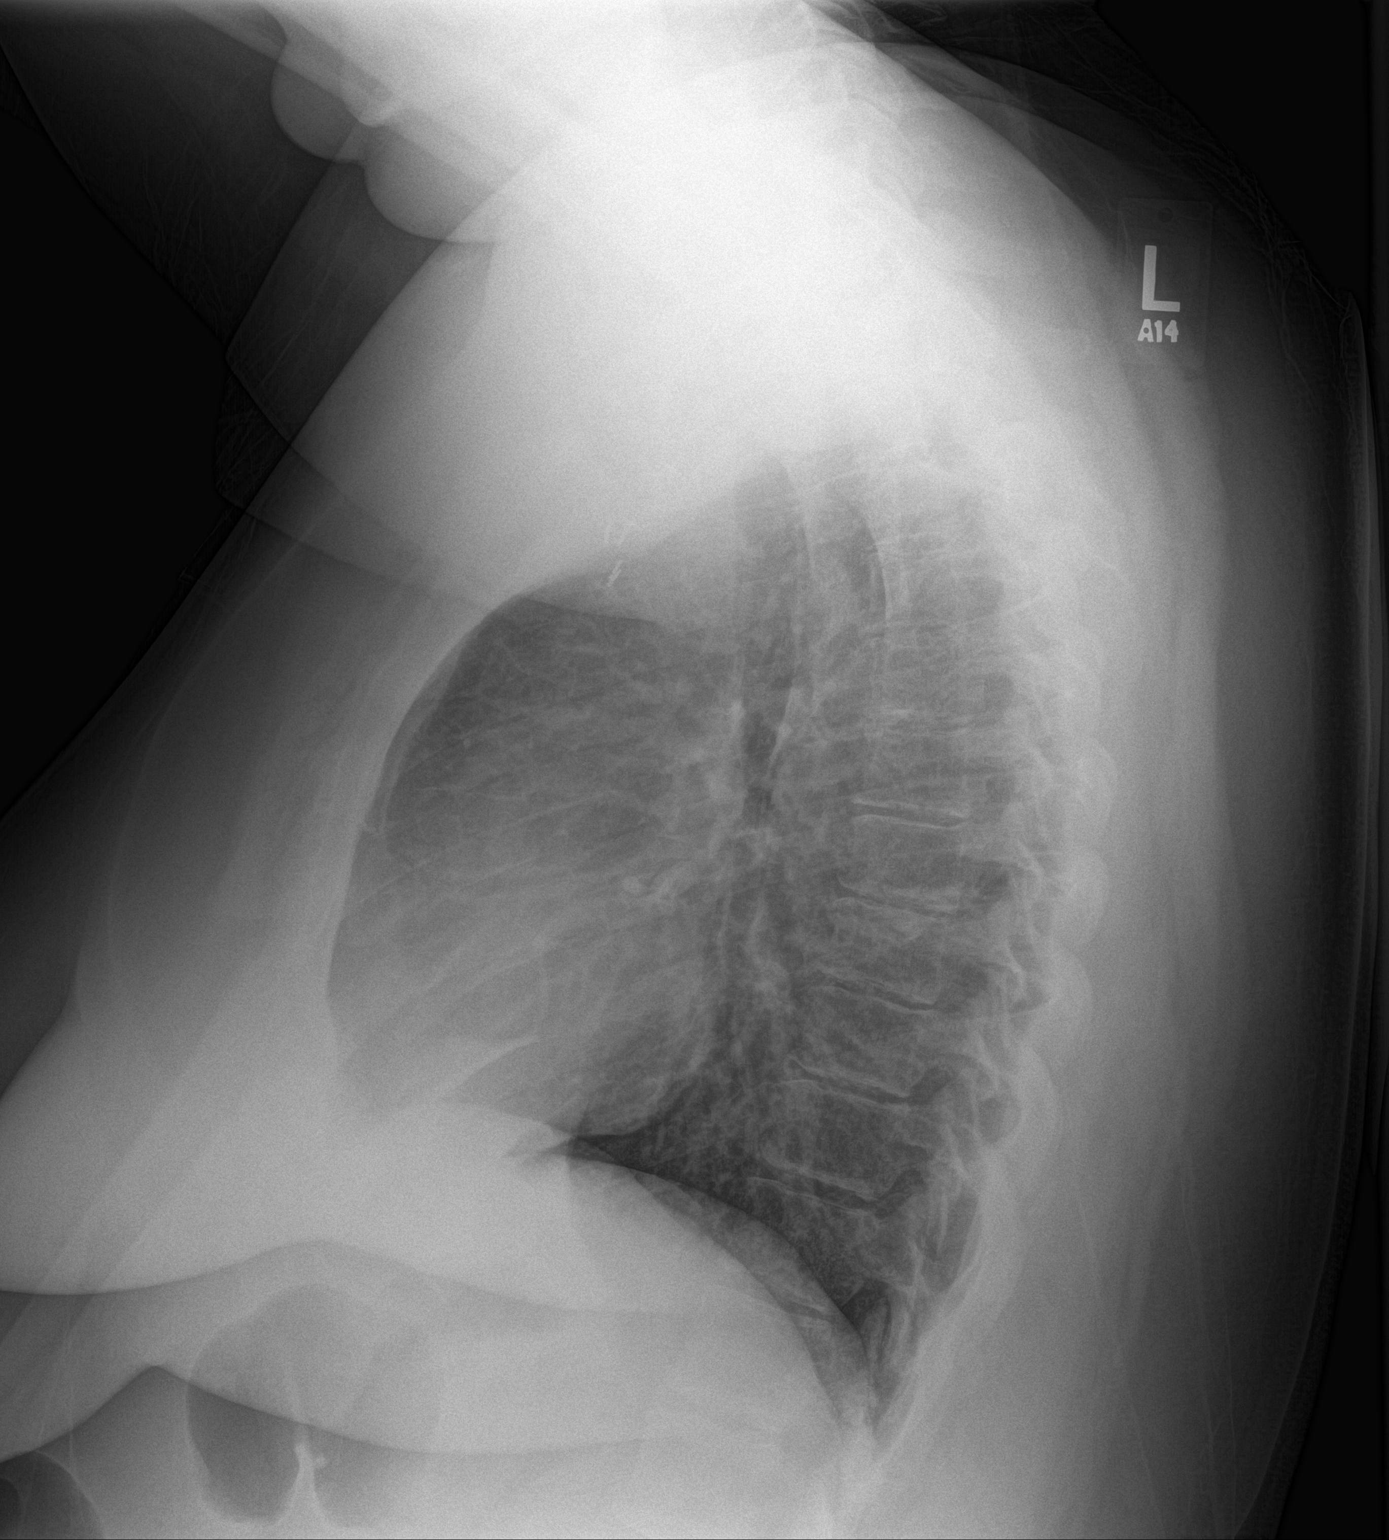

[2 of 2 positions shown; findings below may reference images not displayed]

FINDINGS: Cardiac shadow is within normal limits. Postsurgical changes are
seen. The lungs are well aerated bilaterally. No focal infiltrate or
sizable effusion is noted. No acute bony abnormality is seen.
IMPRESSION: No active cardiopulmonary disease.

## 2017-09-22 ENCOUNTER — Other Ambulatory Visit: Payer: Self-pay | Admitting: Nurse Practitioner

## 2017-09-22 DIAGNOSIS — M545 Low back pain: Secondary | ICD-10-CM

## 2017-10-02 ENCOUNTER — Ambulatory Visit: Payer: Commercial Managed Care - HMO

## 2017-11-09 ENCOUNTER — Ambulatory Visit: Payer: 59

## 2017-11-12 ENCOUNTER — Ambulatory Visit
Admission: RE | Admit: 2017-11-12 | Discharge: 2017-11-12 | Disposition: A | Discharge status: Home / Self Care | Payer: Commercial Managed Care - HMO | Source: Ambulatory Visit | Attending: Nurse Practitioner | Admitting: Nurse Practitioner

## 2017-11-12 DIAGNOSIS — M1288 Other specific arthropathies, not elsewhere classified, other specified site: Secondary | ICD-10-CM | POA: Insufficient documentation

## 2017-11-12 DIAGNOSIS — M545 Low back pain: Secondary | ICD-10-CM | POA: Diagnosis present

## 2018-03-01 ENCOUNTER — Emergency Department
Admission: EM | Admit: 2018-03-01 | Discharge: 2018-03-01 | Disposition: A | Discharge status: Home / Self Care | Payer: Commercial Managed Care - HMO | Attending: Student in an Organized Health Care Education/Training Program | Admitting: Student in an Organized Health Care Education/Training Program

## 2018-03-01 ENCOUNTER — Encounter: Payer: Self-pay | Admitting: Emergency Medicine

## 2018-03-01 ENCOUNTER — Other Ambulatory Visit: Payer: Self-pay

## 2018-03-01 ENCOUNTER — Emergency Department: Payer: Commercial Managed Care - HMO

## 2018-03-01 DIAGNOSIS — Z7984 Long term (current) use of oral hypoglycemic drugs: Secondary | ICD-10-CM | POA: Diagnosis not present

## 2018-03-01 DIAGNOSIS — F17210 Nicotine dependence, cigarettes, uncomplicated: Secondary | ICD-10-CM | POA: Diagnosis not present

## 2018-03-01 DIAGNOSIS — R1011 Right upper quadrant pain: Secondary | ICD-10-CM | POA: Diagnosis present

## 2018-03-01 DIAGNOSIS — R300 Dysuria: Secondary | ICD-10-CM | POA: Insufficient documentation

## 2018-03-01 DIAGNOSIS — I10 Essential (primary) hypertension: Secondary | ICD-10-CM | POA: Insufficient documentation

## 2018-03-01 DIAGNOSIS — E119 Type 2 diabetes mellitus without complications: Secondary | ICD-10-CM | POA: Diagnosis not present

## 2018-03-01 DIAGNOSIS — Z79899 Other long term (current) drug therapy: Secondary | ICD-10-CM | POA: Insufficient documentation

## 2018-03-01 LAB — COMPREHENSIVE METABOLIC PANEL
ALT: 29 U/L (ref 0–44)
AST: 27 U/L (ref 15–41)
Albumin: 4.1 g/dL (ref 3.5–5.0)
Alkaline Phosphatase: 100 U/L (ref 38–126)
Anion gap: 10 (ref 5–15)
BUN: 12 mg/dL (ref 6–20)
CHLORIDE: 101 mmol/L (ref 98–111)
CO2: 28 mmol/L (ref 22–32)
Calcium: 9.5 mg/dL (ref 8.9–10.3)
Creatinine, Ser: 0.79 mg/dL (ref 0.44–1.00)
GFR calc non Af Amer: >60 mL/min (ref >60)
Glucose, Bld: 133 mg/dL — ABNORMAL HIGH (ref 70–99)
POTASSIUM: 4 mmol/L (ref 3.5–5.1)
SODIUM: 139 mmol/L (ref 135–145)
Total Bilirubin: 0.5 mg/dL (ref 0.3–1.2)
Total Protein: 7.2 g/dL (ref 6.5–8.1)

## 2018-03-01 LAB — CBC
HEMATOCRIT: 35.8 % (ref 35.0–47.0)
Hemoglobin: 12.4 g/dL (ref 12.0–16.0)
MCH: 31.7 pg (ref 26.0–34.0)
MCHC: 34.6 g/dL (ref 32.0–36.0)
MCV: 91.8 fL (ref 80.0–100.0)
Platelets: 331 10*3/uL (ref 150–440)
RBC: 3.9 MIL/uL (ref 3.80–5.20)
RDW: 14.7 % — ABNORMAL HIGH (ref 11.5–14.5)
WBC: 12.4 10*3/uL — AB (ref 3.6–11.0)

## 2018-03-01 LAB — URINALYSIS, COMPLETE (UACMP) WITH MICROSCOPIC
Bilirubin Urine: NEGATIVE
Glucose, UA: NEGATIVE mg/dL
Hgb urine dipstick: NEGATIVE
Ketones, ur: NEGATIVE mg/dL
LEUKOCYTES UA: NEGATIVE
Nitrite: NEGATIVE
PROTEIN: NEGATIVE mg/dL
SPECIFIC GRAVITY, URINE: 1.006 (ref 1.005–1.030)
pH: 6 (ref 5.0–8.0)

## 2018-03-01 LAB — LIPASE, BLOOD: LIPASE: 23 U/L (ref 11–51)

## 2018-03-01 MED ORDER — SODIUM CHLORIDE 0.9 % IV BOLUS
1000.0000 mL | Freq: Once | INTRAVENOUS | Status: AC
  Administered 2018-03-01: 1000 mL via INTRAVENOUS

## 2018-03-01 MED ORDER — MORPHINE SULFATE (PF) 4 MG/ML IV SOLN
4.0000 mg | INTRAVENOUS | Status: DC | PRN
  Administered 2018-03-01: 4 mg via INTRAVENOUS
  Filled 2018-03-01: qty 1

## 2018-03-01 MED ORDER — CEPHALEXIN 500 MG PO CAPS
500.0000 mg | ORAL_CAPSULE | Freq: Once | ORAL | Status: AC
  Administered 2018-03-01: 500 mg via ORAL
  Filled 2018-03-01: qty 1

## 2018-03-01 MED ORDER — CEPHALEXIN 500 MG PO CAPS: 500.0000 mg | ORAL_CAPSULE | Freq: Three times a day (TID) | ORAL | 0 refills | Status: AC

## 2018-03-01 MED ORDER — PROMETHAZINE HCL 25 MG/ML IJ SOLN
12.5000 mg | Freq: Four times a day (QID) | INTRAMUSCULAR | Status: DC | PRN
Start: 2018-03-01 — End: 2018-03-01
  Administered 2018-03-01: 12.5 mg via INTRAVENOUS
  Filled 2018-03-01: qty 1

## 2018-03-01 MED ORDER — PROMETHAZINE HCL 12.5 MG PO TABS: 12.5000 mg | ORAL_TABLET | Freq: Four times a day (QID) | ORAL | 0 refills | Status: DC | PRN

## 2018-03-01 NOTE — Discharge Instructions (Signed)

## 2018-03-01 NOTE — ED Notes (Signed)
Pt in [place]

## 2018-03-01 NOTE — ED Notes (Signed)
Pt c/o right lower abdominal and right flank pain X 1 week, has been continuously worsening. RUQ pain that radiates to right shoulder at this time. Recent tx for UTI with PO antibiotics at home, has been complaint with medications.

## 2018-03-01 NOTE — ED Provider Notes (Signed)
[facility] Emergency Department Provider Note    First MD Initiated Contact with Patient 03/01/18 1155     (approximate)  I have reviewed the triage vital signs and the nursing notes.   HISTORY  Chief Complaint  Abdominal Pain    HPI Amanda English is a 42 y.o. female presents the ER with chief complaint of right upper quadrant pain right flank pain radiating her right shoulder.  Became acutely worse today but she is been struggling with these symptoms for the past several weeks.  Was recently started on Cipro for urinary tract infection.  She describes the pain is achy in nature.  States it is constant.  Mild to moderate in severity.  Does still have her appendix as well as gallbladder.  No history of kidney stones.    Past Medical History:  Diagnosis Date  . Anxiety   . Arthritis   . Chiari malformation   . Depression   . Diabetes mellitus without complication (HCC)   . Diabetes mellitus, type II (HCC)   . High cholesterol   . Hypertension   . Mitral valve regurgitation    Family History  Problem Relation Age of Onset  . Depression Mother   . Bipolar disorder Mother   . Alzheimer's disease Father   . Thyroid disease Brother    Past Surgical History:  Procedure Laterality Date  . ABDOMINAL HYSTERECTOMY    . APPENDECTOMY    . CESAREAN SECTION    . NOSE SURGERY    . STERNAL WIRE REMOVAL     Patient Active Problem List   Diagnosis Date Noted  . Plantar fasciitis 12/06/2015  . Achilles tendonitis, bilateral 12/06/2015  . Contusion of bone 08/31/2015  . Loss of memory 07/14/2015  . Chronic tension-type headache, not intractable 06/18/2015  . Tobacco abuse 04/14/2014  . Headache 01/27/2014  . Memory loss 01/27/2014  . Sleep disorder 01/27/2014  . Arthralgia 09/12/2013  . H/O juvenile rheumatoid arthritis 09/12/2013  . 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 Obesity 09/12/2013  . Personal history of arthritis 09/12/2013  .  Bilateral hand pain 08/24/2013  . Fibromyalgia 08/24/2013  . Myalgia and myositis 08/24/2013  . Right ankle swelling 08/24/2013  . Rheumatoid arthritis (HCC) 08/24/2013  . Diffuse connective tissue disease (HCC) 08/24/2013      Prior to Admission medications   Medication Sig Start Date End Date Taking? Authorizing Provider  ALPRAZolam (XANAX) 0.25 MG tablet Take 1 tablet (0.25 mg total) by mouth 2 (two) times daily as needed for anxiety. 08/12/16   Clapacs, Jackquline Denmark, MD  ALPRAZolam Prudy Feeler) 1 MG tablet Take 1 mg by mouth.    [provider]  azithromycin (ZITHROMAX) 250 MG tablet take 2 tablets by mouth on day 1 then 1 tablet on days 2 through 5 02/11/16   [provider]  cephALEXin (KEFLEX) 500 MG capsule Take 1 capsule (500 mg total) by mouth 3 (three) times daily for 7 days. 03/01/18 03/08/18  Willy Eddy, MD  cetirizine (ZYRTEC) 10 MG tablet Take 10 mg by mouth.    [provider]  estradiol (VIVELLE-DOT) 0.05 MG/24HR patch Place onto the skin.    [provider]  fenofibrate 160 MG tablet take 1 tablet by mouth every evening for cholesterol 04/17/15   [provider]  fluconazole (DIFLUCAN) 150 MG tablet take 1 tablet by mouth as a single dose may repeat IN 3 days 02/12/16   [provider]  ketorolac (TORADOL) 10 MG tablet  06/21/14   [provider]  Ketorolac Tromethamine (SPRIX) 15.75 MG/SPRAY SOLN Place into the nose.    [provider]  Ketorolac Tromethamine 15.75 MG/SPRAY SOLN Ketorolac (Sprix) nasal spray 15.75 mg/spray in each nostril every 6-8 hours for pain. 1 box 04/14/14   [provider]  lisinopril (PRINIVIL,ZESTRIL) 10 MG tablet Take 10 mg by mouth every morning. 04/16/16   [provider]  LYRICA 75 MG capsule take 1 capsule by mouth twice a day 09/02/16   Vivi Barrack, DPM  metFORMIN (GLUCOPHAGE) 500 MG tablet Take by mouth. 04/17/15   [provider]  methylPREDNISolone  (MEDROL DOSEPAK) 4 MG TBPK tablet Take as directed 12/06/15   Vivi Barrack, DPM  metoprolol succinate (TOPROL-XL) 50 MG 24 hr tablet Take by mouth. 12/15/14   [provider]  omeprazole (PRILOSEC) 40 MG capsule  04/17/16   [provider]  ondansetron (ZOFRAN) 8 MG tablet  05/23/14   [provider]  pregabalin (LYRICA) 75 MG capsule Take 1 capsule (75 mg total) by mouth 2 (two) times daily. 05/21/16   Vivi Barrack, DPM  promethazine (PHENERGAN) 12.5 MG tablet Take 1 tablet (12.5 mg total) by mouth every 6 (six) hours as needed for nausea or vomiting. 03/01/18   Willy Eddy, MD  topiramate (TOPAMAX) 100 MG tablet Take 100 mg by mouth.    [provider]  traMADol (ULTRAM) 50 MG tablet Take 1 tablet (50 mg total) by mouth every 8 (eight) hours as needed. 01/17/16   Vivi Barrack, DPM  traZODone (DESYREL) 50 MG tablet Take 1 tablet (50 mg total) by mouth at bedtime. 06/23/16   Lelia Jons North, MD  venlafaxine XR (EFFEXOR-XR) 150 MG 24 hr capsule Take 1 capsule (150 mg total) by mouth daily with breakfast. 06/23/16   Olga Seyler North, MD    Allergies Nsaids    Social History Social History   Tobacco Use  . Smoking status: Current Every Day Smoker    Packs/day: 0.50    Types: Cigarettes  . Smokeless tobacco: Never Used  Substance Use Topics  . Alcohol use: No  . Drug use: No    Review of Systems Patient denies headaches, rhinorrhea, blurry vision, numbness, shortness of breath, chest pain, edema, cough, abdominal pain, nausea, vomiting, diarrhea, dysuria, fevers, rashes or hallucinations unless otherwise stated above in HPI. ____________________________________________   PHYSICAL EXAM:  VITAL SIGNS: Vitals:   03/01/18 1407 03/01/18 1408  BP: (!) 108/56   Pulse: 62 62  Resp:    Temp:    SpO2: 98% 99%    Constitutional: Alert and oriented.  Eyes: Conjunctivae are normal.  Head: Atraumatic. Nose: No  congestion/rhinnorhea. Mouth/Throat: Mucous membranes are moist.   Neck: No stridor. Painless ROM.  Cardiovascular: Normal rate, regular rhythm. Grossly normal heart sounds.  Good peripheral circulation. Respiratory: Normal respiratory effort.  No retractions. Lungs CTAB. Gastrointestinal: Soft and nontender. No distention. No abdominal bruits. No CVA tenderness. Genitourinary:  Musculoskeletal: No lower extremity tenderness nor edema.  No joint effusions. Neurologic:  Normal speech and language. No gross focal neurologic deficits are appreciated. No facial droop Skin:  Skin is warm, dry and intact. No rash noted. Psychiatric: Mood and affect are normal. Speech and behavior are normal.  ____________________________________________   LABS (all labs ordered are listed, but only abnormal results are displayed)  Results for orders placed or performed during the hospital encounter of 03/01/18 (from the past 24 hour(s))  Lipase, blood     Status:  None   Collection Time: 03/01/18 10:47 AM  Result Value Ref Range   Lipase 23 11 - 51 U/L  Comprehensive metabolic panel     Status: Abnormal   Collection Time: 03/01/18 10:47 AM  Result Value Ref Range   Sodium 139 135 - 145 mmol/L   Potassium 4.0 3.5 - 5.1 mmol/L   Chloride 101 98 - 111 mmol/L   CO2 28 22 - 32 mmol/L   Glucose, Bld 133 (H) 70 - 99 mg/dL   BUN 12 6 - 20 mg/dL   Creatinine, Ser 6.01 0.44 - 1.00 mg/dL   Calcium 9.5 8.9 - 09.3 mg/dL   Total Protein 7.2 6.5 - 8.1 g/dL   Albumin 4.1 3.5 - 5.0 g/dL   AST 27 15 - 41 U/L   ALT 29 0 - 44 U/L   Alkaline Phosphatase 100 38 - 126 U/L   Total Bilirubin 0.5 0.3 - 1.2 mg/dL   GFR calc non Af Amer >60 >60 mL/min   GFR calc Af Amer >60 >60 mL/min   Anion gap 10 5 - 15  CBC     Status: Abnormal   Collection Time: 03/01/18 10:47 AM  Result Value Ref Range   WBC 12.4 (H) 3.6 - 11.0 K/uL   RBC 3.90 3.80 - 5.20 MIL/uL   Hemoglobin 12.4 12.0 - 16.0 g/dL   HCT 23.5 57.3 - 22.0 %   MCV  91.8 80.0 - 100.0 fL   MCH 31.7 26.0 - 34.0 pg   MCHC 34.6 32.0 - 36.0 g/dL   RDW 25.4 (H) 27.0 - 62.3 %   Platelets 331 150 - 440 K/uL  Urinalysis, Complete w Microscopic     Status: Abnormal   Collection Time: 03/01/18 10:47 AM  Result Value Ref Range   Color, Urine STRAW (A) YELLOW   APPearance CLEAR (A) CLEAR   Specific Gravity, Urine 1.006 1.005 - 1.030   pH 6.0 5.0 - 8.0   Glucose, UA NEGATIVE NEGATIVE mg/dL   Hgb urine dipstick NEGATIVE NEGATIVE   Bilirubin Urine NEGATIVE NEGATIVE   Ketones, ur NEGATIVE NEGATIVE mg/dL   Protein, ur NEGATIVE NEGATIVE mg/dL   Nitrite NEGATIVE NEGATIVE   Leukocytes, UA NEGATIVE NEGATIVE   WBC, UA 0-5 0 - 5 WBC/hpf   Bacteria, UA RARE (A) NONE SEEN   Squamous Epithelial / LPF 0-5 0 - 5   ____________________________________________  EKG My review and personal interpretation at Time: 12:23   Indication: ruq abd pain  Rate: 70  Rhythm: sinus Axis: normal Other: normal intervals, no stemi ____________________________________________  RADIOLOGY  I personally reviewed all radiographic images ordered to evaluate for the above acute complaints and reviewed radiology reports and findings.  These findings were personally discussed with the patient.  Please see medical record for radiology report.  ____________________________________________   PROCEDURES  Procedure(s) performed:  Procedures    Critical Care performed: no ____________________________________________   INITIAL IMPRESSION / ASSESSMENT AND PLAN / ED COURSE  Pertinent labs & imaging results that were available during my care of the patient were reviewed by me and considered in my medical decision making (see chart for details).   DDX: Stone, Pilo, UTI, cholecystitis, cholangitis, cholelithiasis  Amanda English is a 42 y.o. who presents to the ED with symptoms as described above.  Patient is AFVSS in ED. Exam as above. Given current presentation have considered the above  differential. The patient will be placed on continuous pulse oximetry and telemetry for monitoring.  Laboratory evaluation will  be sent to evaluate for the above complaints.     Clinical Course as of Mar 02 1455  Mon Mar 01, 2018  1455 We discussed the results of her ultrasound.  Recent presentation seems that she is likely suffering from partially treated pyelonephritis but no significant acidosis or evidence of sepsis.  Her symptoms are significant improved after IV fluids as well as IV antiemetic and IV pain medication.  Repeat abdominal exam is soft benign.  She status post appendectomy.  Unlikely colitis.  Tolerating oral hydration.  Will send urine for culture and start patient on Keflex.  Have discussed with the patient and available family all diagnostics and treatments performed thus far and all questions were answered to the best of my ability. The patient demonstrates understanding and agreement with plan.  She was instructed to return to the ER and 12 to 24 hours if symptoms worsen.    [PR]    Clinical Course User Index [PR] Willy Eddy, MD     As part of my medical decision making, I reviewed the following data within the electronic MEDICAL RECORD NUMBER Nursing notes reviewed and incorporated, Labs reviewed, notes from prior ED visits and [place] Controlled Substance Database   ____________________________________________   FINAL CLINICAL IMPRESSION(S) / ED DIAGNOSES  Final diagnoses:  RUQ abdominal pain  Dysuria      NEW MEDICATIONS STARTED DURING THIS VISIT:  New Prescriptions   CEPHALEXIN (KEFLEX) 500 MG CAPSULE    Take 1 capsule (500 mg total) by mouth 3 (three) times daily for 7 days.   PROMETHAZINE (PHENERGAN) 12.5 MG TABLET    Take 1 tablet (12.5 mg total) by mouth every 6 (six) hours as needed for nausea or vomiting.     Note:  This document was prepared using Dragon voice recognition software and may include unintentional dictation errors.    Willy Eddy, MD 03/01/18 [phone]

## 2018-03-01 NOTE — ED Notes (Signed)
Pt alert and oriented X4, active, cooperative, pt in NAD. RR even and unlabored, color WNL.  Pt informed to return if any life threatening symptoms occur.  Discharge and followup instructions reviewed.  

## 2018-03-01 NOTE — ED Triage Notes (Signed)
Pt states nausea, right shoulder pain and right flank pain that began today, being treated for uti as well.

## 2018-03-01 NOTE — ED Notes (Signed)
Patient transported to Ultrasound 

## 2018-07-08 ENCOUNTER — Ambulatory Visit: Payer: Self-pay | Admitting: Gastroenterology

## 2018-08-20 ENCOUNTER — Ambulatory Visit: Payer: 59 | Admitting: Gastroenterology

## 2018-08-20 ENCOUNTER — Encounter: Payer: Self-pay | Admitting: Gastroenterology

## 2018-08-20 ENCOUNTER — Other Ambulatory Visit: Payer: Self-pay

## 2018-08-20 VITALS — BP 109/74 | HR 69 | Resp 17 | Ht 63.0 in | Wt 259.8 lb

## 2018-08-20 DIAGNOSIS — K640 First degree hemorrhoids: Secondary | ICD-10-CM

## 2018-08-20 DIAGNOSIS — K625 Hemorrhage of anus and rectum: Secondary | ICD-10-CM

## 2018-08-20 DIAGNOSIS — S8390XA Sprain of unspecified site of unspecified knee, initial encounter: Secondary | ICD-10-CM | POA: Insufficient documentation

## 2018-08-20 NOTE — Progress Notes (Signed)
Amanda Repressohini R Vanga, MD [address]  [address]  [place], [place] [address]  Main: [phone]  Fax: [phone]    Gastroenterology Consultation  Referring Provider:     Andria English, Amanda M, MD Primary Care Physician:  Amanda English, Amanda English, Amanda English Primary Gastroenterologist:  Dr. Arlyss Repressohini R English Reason for Consultation:     Rectal bleeding, external hemorrhoids        HPI:   Amanda English is a 43 y.o. female referred by Dr. Fayrene English, Amanda English, Amanda English  for consultation & management of rectal bleeding and external hemorrhoid.  She saw Dr. Marin Olphristopher English about 2 months ago secondary to severe rectal pain and bleeding and was found to have thrombosed external hemorrhoid.  The pain almost alleviated by the time she saw Dr. Cliffton AstersWhite.  Therefore, this was not lanced.  She was prescribed nifedipine ointment and was referred to me for hemorrhoid ligation.  Patient has used nifedipine ointment for about a month.  Patient reports that she has been dealing with hemorrhoidal symptoms including rectal pressure, discomfort, itching, intermittent rectal bleeding for more than 6 months at least.  She has tried over-the-counter hemorrhoidal creams which provided minimal relief.  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 Anxiety   . Arthritis   . Chiari malformation   . Depression   . Diabetes mellitus without complication (HCC)   . Diabetes mellitus, type II (HCC)   . High cholesterol   . Hypertension   . Mitral valve regurgitation     Past Surgical History:  Procedure Laterality Date  .  ABDOMINAL HYSTERECTOMY    . APPENDECTOMY    . CESAREAN SECTION    . NOSE SURGERY    . STERNAL WIRE REMOVAL      Current Outpatient Medications:  .  ALPRAZolam (XANAX) 1 MG tablet, Take 1 mg by mouth., Disp: , Rfl:  .  atorvastatin (LIPITOR) 40 MG tablet, atorvastatin 40 mg tablet, Disp: , Rfl:  .  cetirizine (ZYRTEC) 10 MG tablet, Take 10 mg by mouth., Disp: , Rfl:  .  fluticasone (FLONASE) 50 MCG/ACT nasal spray, fluticasone propionate 50 mcg/actuation nasal spray,suspension  instill 2 sprays into each nostril once daily, Disp: , Rfl:  .  ketorolac (TORADOL) 10 MG tablet, , Disp: , Rfl: 0 .  Lancets (ONETOUCH DELICA PLUS LANCET33G) MISC, USE TO CHECK SUGARS ONCE DAILY DX E11.9, Disp: , Rfl:  .  ONETOUCH VERIO test strip, USE TO CHECK SUGARS DAILY, Disp: , Rfl:  .  promethazine (PHENERGAN) 12.5 MG tablet, Take 1 tablet (12.5 mg total) by mouth every 6 (six) hours as needed for nausea or vomiting., Disp: 12 tablet, Rfl: 0 .  traZODone (DESYREL) 100 MG tablet, trazodone 100 mg tablet, Disp: , Rfl:  .  venlafaxine (EFFEXOR) 37.5 MG tablet, Take by mouth., Disp: , Rfl:   Current Facility-Administered Medications:  .  triamcinolone acetonide (KENALOG) 10 MG/ML injection 10 mg, 10 mg, Other, Once, Wagoner, Matthew R, DPM .  triamcinolone acetonide (KENALOG) 10 MG/ML injection 10 mg, 10 mg, Other, Once, Vivi BarrackWagoner, Matthew R, DPM   Family History  Problem Relation Age  of Onset  . Depression Mother   . Bipolar disorder Mother   . Alzheimer's disease Father   . Thyroid disease Brother      Social History   Tobacco Use  . Smoking status: Current Every Day Smoker    Packs/day: 0.50    Types: Cigarettes  . Smokeless tobacco: Never Used  Substance Use Topics  . Alcohol use: No  . Drug use: No    Allergies as of 08/20/2018 - Review Complete 08/20/2018  Allergen Reaction Noted  . Ibuprofen  08/20/2018  . Nsaids Swelling 03/23/2014    Review of Systems:    All systems reviewed and  negative except where noted in HPI.   Physical Exam:  BP 109/74 (BP Location: Left Arm, Patient Position: Sitting, Cuff Size: Large)   Pulse 69   Resp 17   Ht 5\' 3"  (1.6 m)   Wt 259 lb 12.8 oz (117.8 kg)   SpO2 98%   BMI 46.02 kg/m  No LMP recorded. Patient has had a hysterectomy.  General:   Alert,  Well-developed, well-nourished, pleasant and cooperative in NAD Head:  Normocephalic and atraumatic. Eyes:  Sclera clear, no icterus.   Conjunctiva pink. Ears:  Normal auditory acuity. Nose:  No deformity, discharge, or lesions. Mouth:  No deformity or lesions,oropharynx pink & moist. Neck:  Supple; no masses or thyromegaly. Lungs:  Respirations even and unlabored.  Clear throughout to auscultation.   No wheezes, crackles, or rhonchi. No acute distress. Heart:  Regular rate and rhythm; no murmurs, clicks, rubs, or gallops. Abdomen:  Normal bowel sounds. Soft, non-tender and non-distended without masses, hepatosplenomegaly or hernias noted.  No guarding or rebound tenderness.   Rectal: Not performed Msk:  Symmetrical without gross deformities. Good, equal movement & strength bilaterally. Pulses:  Normal pulses noted. Extremities:  No clubbing or edema.  No cyanosis. Neurologic:  Alert and oriented x3;  grossly normal neurologically. Skin:  Intact without significant lesions or rashes. No jaundice. Psych:  Alert and cooperative. Normal mood and affect.  Imaging Studies: Reviewed  Assessment and Plan:   Amanda English is a 43 y.o. Caucasian female with metabolic syndrome, grade I symptomatic external hemorrhoids, chronic intermittent BRBPR  Rectal bleeding Recommend diagnostic colonoscopy given her age and chronicity of bleeding.  Patient would like to schedule the procedure and check with her insurance company about the co-pay.  I also discussed with her about alternative option which is performing at least a flexible sigmoidoscopy if she cannot afford colonoscopy.  Grade 1  external hemorrhoids Discussed with her about outpatient hemorrhoid ligation after the colonoscopy  Fatty liver LFTs are normal Highly encouraged her to avoid diet sodas, strict control of diabetes   Follow up in 4 to 6 weeks   Amanda Repress, MD

## 2018-08-26 ENCOUNTER — Telehealth: Payer: Self-pay | Admitting: Gastroenterology

## 2018-08-26 NOTE — Telephone Encounter (Signed)
Patient l/m on v/m to cancel her colonoscopy on 08-30-18 with Dr Allegra Lai & r/s

## 2018-08-30 ENCOUNTER — Encounter: Admission: RE | Payer: Self-pay | Source: Home / Self Care

## 2018-08-30 ENCOUNTER — Ambulatory Visit: Admission: RE | Admit: 2018-08-30 | Payer: 59 | Source: Home / Self Care | Admitting: Gastroenterology

## 2018-08-30 SURGERY — COLONOSCOPY WITH PROPOFOL
Anesthesia: General

## 2018-09-17 ENCOUNTER — Emergency Department: Payer: 59

## 2018-09-17 ENCOUNTER — Emergency Department
Admission: EM | Admit: 2018-09-17 | Discharge: 2018-09-17 | Disposition: A | Discharge status: Home / Self Care | Payer: 59 | Attending: Emergency Medicine | Admitting: Emergency Medicine

## 2018-09-17 ENCOUNTER — Other Ambulatory Visit: Payer: Self-pay

## 2018-09-17 DIAGNOSIS — Z79899 Other long term (current) drug therapy: Secondary | ICD-10-CM | POA: Diagnosis not present

## 2018-09-17 DIAGNOSIS — R1032 Left lower quadrant pain: Secondary | ICD-10-CM | POA: Diagnosis not present

## 2018-09-17 DIAGNOSIS — E119 Type 2 diabetes mellitus without complications: Secondary | ICD-10-CM | POA: Insufficient documentation

## 2018-09-17 DIAGNOSIS — Z7984 Long term (current) use of oral hypoglycemic drugs: Secondary | ICD-10-CM | POA: Diagnosis not present

## 2018-09-17 DIAGNOSIS — I10 Essential (primary) hypertension: Secondary | ICD-10-CM | POA: Insufficient documentation

## 2018-09-17 DIAGNOSIS — Z87891 Personal history of nicotine dependence: Secondary | ICD-10-CM | POA: Diagnosis not present

## 2018-09-17 DIAGNOSIS — R109 Unspecified abdominal pain: Secondary | ICD-10-CM | POA: Diagnosis present

## 2018-09-17 LAB — CBC
HCT: 35.8 % — ABNORMAL LOW (ref 36.0–46.0)
Hemoglobin: 11.5 g/dL — ABNORMAL LOW (ref 12.0–15.0)
MCH: 29.1 pg (ref 26.0–34.0)
MCHC: 32.1 g/dL (ref 30.0–36.0)
MCV: 90.6 fL (ref 80.0–100.0)
Platelets: 307 10*3/uL (ref 150–400)
RBC: 3.95 MIL/uL (ref 3.87–5.11)
RDW: 14 % (ref 11.5–15.5)
WBC: 10.6 10*3/uL — AB (ref 4.0–10.5)
nRBC: 0 % (ref 0.0–0.2)

## 2018-09-17 LAB — URINALYSIS, COMPLETE (UACMP) WITH MICROSCOPIC
BILIRUBIN URINE: NEGATIVE
Glucose, UA: NEGATIVE mg/dL
Hgb urine dipstick: NEGATIVE
KETONES UR: NEGATIVE mg/dL
Leukocytes,Ua: NEGATIVE
Nitrite: NEGATIVE
Protein, ur: NEGATIVE mg/dL
Specific Gravity, Urine: 1.004 — ABNORMAL LOW (ref 1.005–1.030)
pH: 5 (ref 5.0–8.0)

## 2018-09-17 LAB — COMPREHENSIVE METABOLIC PANEL
ALK PHOS: 85 U/L (ref 38–126)
ALT: 41 U/L (ref 0–44)
ANION GAP: 11 (ref 5–15)
AST: 33 U/L (ref 15–41)
Albumin: 4.5 g/dL (ref 3.5–5.0)
BUN: 9 mg/dL (ref 6–20)
CO2: 27 mmol/L (ref 22–32)
Calcium: 9.1 mg/dL (ref 8.9–10.3)
Chloride: 98 mmol/L (ref 98–111)
Creatinine, Ser: 0.7 mg/dL (ref 0.44–1.00)
GFR calc Af Amer: >60 mL/min (ref >60)
Glucose, Bld: 92 mg/dL (ref 70–99)
Potassium: 3.3 mmol/L — ABNORMAL LOW (ref 3.5–5.1)
Sodium: 136 mmol/L (ref 135–145)
Total Bilirubin: 0.7 mg/dL (ref 0.3–1.2)
Total Protein: 7.3 g/dL (ref 6.5–8.1)

## 2018-09-17 LAB — LIPASE, BLOOD: Lipase: 24 U/L (ref 11–51)

## 2018-09-17 MED ORDER — SODIUM CHLORIDE 0.9% FLUSH: 3.0000 mL | Freq: Once | INTRAVENOUS | Status: DC

## 2018-09-17 MED ORDER — HYDROCODONE-ACETAMINOPHEN 5-325 MG PO TABS: 1.0000 | ORAL_TABLET | Freq: Four times a day (QID) | ORAL | 0 refills | Status: AC | PRN

## 2018-09-17 MED ORDER — DICYCLOMINE HCL 20 MG PO TABS: 20.0000 mg | ORAL_TABLET | Freq: Three times a day (TID) | ORAL | 0 refills | Status: DC | PRN

## 2018-09-17 MED ORDER — OXYCODONE HCL 5 MG PO TABS
10.0000 mg | ORAL_TABLET | Freq: Once | ORAL | Status: AC
  Administered 2018-09-17: 10 mg via ORAL
  Filled 2018-09-17: qty 2

## 2018-09-17 MED ORDER — ONDANSETRON 4 MG PO TBDP
4.0000 mg | ORAL_TABLET | Freq: Once | ORAL | Status: AC
  Administered 2018-09-17: 4 mg via ORAL
  Filled 2018-09-17: qty 1

## 2018-09-17 NOTE — ED Provider Notes (Signed)
[facility] Emergency Department Provider Note  ____________________________________________  Time seen: Approximately 4:33 PM  I have reviewed the triage vital signs and the nursing notes.   HISTORY  Chief Complaint Abdominal Pain    HPI Amanda English is a 43 y.o. female who presents to the emergency department for treatment and evaluation of abdominal pain since Wednesday. Pain is worse today. Pain is in the left side that shoots up into the abdomen and has bilateral flank pain. She denies fever, vomiting, or diarrhea. She was evaluated by her PCP who checked a urine and advised her to come to the ER since the results were negative.   Past Medical History:  Diagnosis Date  . Anxiety   . Arthritis   . Chiari malformation   . Depression   . Diabetes mellitus without complication (HCC)   . Diabetes mellitus, type II (HCC)   . High cholesterol   . Hypertension   . Mitral valve regurgitation     Patient Active Problem List   Diagnosis Date Noted  . Sprain of knee and leg 08/20/2018  . Plantar fasciitis 12/06/2015  . Achilles tendonitis, bilateral 12/06/2015  . Contusion of bone 08/31/2015  . Loss of memory 07/14/2015  . Chronic tension-type headache, not intractable 06/18/2015  . Hyperlipidemia 04/17/2015  . Migraine headache 03/23/2015  . Tobacco abuse 04/14/2014  . Headache 01/27/2014  . Memory loss 01/27/2014  . Sleep disorder 01/27/2014  . Arthralgia 09/12/2013  . H/O juvenile rheumatoid arthritis 09/12/2013  . 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 Obesity 09/12/2013  . Personal history of arthritis 09/12/2013  . Bilateral hand pain 08/24/2013  . Fibromyalgia 08/24/2013  . Myalgia and myositis 08/24/2013  . Right ankle swelling 08/24/2013  . Rheumatoid arthritis (HCC) 08/24/2013  . Diffuse connective tissue disease (HCC) 08/24/2013  . 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 Arnold-Chiari malformation  (HCC) 08/08/2013  . Depression 08/08/2013  . Diabetes mellitus (HCC) 08/08/2013  . Hypertension 08/08/2013    Past Surgical History:  Procedure Laterality Date  . ABDOMINAL HYSTERECTOMY    . APPENDECTOMY    . CESAREAN SECTION    . NOSE SURGERY    . STERNAL WIRE REMOVAL      Prior to Admission medications   Medication Sig Start Date End Date Taking? Authorizing Provider  metFORMIN (GLUCOPHAGE) 1000 MG tablet Take 500 mg by mouth 2 (two) times daily with a meal.   Yes [provider]  metoprolol succinate (TOPROL-XL) 25 MG 24 hr tablet Take 25 mg by mouth daily.   Yes [provider]  spironolactone (ALDACTONE) 25 MG tablet Take 25 mg by mouth daily.   Yes [provider]  ALPRAZolam Prudy Feeler) 1 MG tablet Take 1 mg by mouth.    [provider]  atorvastatin (LIPITOR) 40 MG tablet atorvastatin 40 mg tablet    [provider]  cetirizine (ZYRTEC) 10 MG tablet Take 10 mg by mouth.    [provider]  dicyclomine (BENTYL) 20 MG tablet Take 1 tablet (20 mg total) by mouth 3 (three) times daily as needed for spasms. 09/17/18 09/17/19  Vanice Rappa, Kasandra Knudsen, FNP  fluticasone (FLONASE) 50 MCG/ACT nasal spray fluticasone propionate 50 mcg/actuation nasal spray,suspension  instill 2 sprays into each nostril once daily    [provider]  HYDROcodone-acetaminophen (NORCO/VICODIN) 5-325 MG tablet Take 1 tablet by mouth every 6 (six) hours as needed for up to 3 days for severe pain. 09/17/18 09/20/18  Kem Boroughs  B, FNP  ketorolac (TORADOL) 10 MG tablet  06/21/14   [provider]  Lancets (ONETOUCH DELICA PLUS LANCET33G) MISC USE TO CHECK SUGARS ONCE DAILY DX E11.9 06/03/18   [provider]  Letta PateNETOUCH VERIO test strip USE TO CHECK SUGARS DAILY 06/04/18   [provider]  promethazine (PHENERGAN) 12.5 MG tablet Take 1 tablet (12.5 mg total) by mouth every 6 (six) hours as needed for nausea or vomiting. 03/01/18   Willy Eddyobinson,  Patrick, MD  traZODone (DESYREL) 100 MG tablet trazodone 100 mg tablet 05/30/16   [provider]  venlafaxine (EFFEXOR) 37.5 MG tablet Take by mouth.    [provider]    Allergies Ibuprofen and Nsaids  Family History  Problem Relation Age of Onset  . Depression Mother   . Bipolar disorder Mother   . Alzheimer's disease Father   . Thyroid disease Brother     Social History Social History   Tobacco Use  . Smoking status: Former Smoker    Packs/day: 0.50    Types: Cigarettes  . Smokeless tobacco: Never Used  Substance Use Topics  . Alcohol use: No  . Drug use: No    Review of Systems Constitutional: Negative for fever. Respiratory: Negative for shortness of breath or cough. Gastrointestinal: Positive for abdominal pain; negative for nausea , negative for vomiting. Genitourinary: Negative for dysuria , negative for vaginal discharge. Musculoskeletal: Positive for back pain. Skin: Negative for acute skin changes/rash/lesion. ____________________________________________   PHYSICAL EXAM:  VITAL SIGNS: ED Triage Vitals [09/17/18 1258]  Enc Vitals Group     BP 118/68     Pulse Rate 74     Resp 17     Temp 98 F (36.7 C)     Temp Source Oral     SpO2 96 %     Weight 260 lb (117.9 kg)     Height 5\' 3"  (1.6 m)     Head Circumference      Peak Flow      Pain Score 8     Pain Loc      Pain Edu?      Excl. in GC?     Constitutional: Alert and oriented. Well appearing and in no acute distress. Eyes: Conjunctivae are normal. Head: Atraumatic. Nose: No congestion/rhinnorhea. Mouth/Throat: Mucous membranes are moist. Respiratory: Normal respiratory effort.  No retractions. Gastrointestinal: Bowel sounds active x 4; Abdomen is soft without rebound or guarding. Genitourinary: Pelvic exam: not indicated. Musculoskeletal: No extremity tenderness nor edema.  Neurologic:  Normal speech and language. No gross focal neurologic deficits are appreciated.  Speech is normal. No gait instability. Skin:  Skin is warm, dry and intact. No rash noted on exposed skin. Psychiatric: Mood and affect are normal. Speech and behavior are normal.  ____________________________________________   LABS (all labs ordered are listed, but only abnormal results are displayed)  Labs Reviewed  COMPREHENSIVE METABOLIC PANEL - Abnormal; Notable for the following components:      Result Value   Potassium 3.3 (*)    All other components within normal limits  CBC - Abnormal; Notable for the following components:   WBC 10.6 (*)    Hemoglobin 11.5 (*)    HCT 35.8 (*)    All other components within normal limits  URINALYSIS, COMPLETE (UACMP) WITH MICROSCOPIC - Abnormal; Notable for the following components:   Color, Urine COLORLESS (*)    APPearance CLEAR (*)    Specific Gravity, Urine 1.004 (*)    Bacteria, UA RARE (*)  All other components within normal limits  URINE CULTURE  LIPASE, BLOOD   ____________________________________________  RADIOLOGY  CT renal stone study shows no renal or ureteral stones and no hydronephrosis.  There is fatty infiltration of the liver.  Otherwise, the CT is reassuring. ____________________________________________  Procedures  ____________________________________________  43 year old female presents to the emergency department for treatment and evaluation of left lower quadrant pain that shoots into the left upper quadrant since Tuesday with associated nausea.  She has not experienced any vomiting, diarrhea and denies constipation.  CT of the abdomen and pelvis without contrast was reassuring.  White blood cell count is just mildly elevated at 10.6, however the CMP and lipase is reassuring.  Urinalysis does show rare bacteria and a urine culture has been requested.  Exam is reassuring. While here, the patient was given oxycodone with significant relief.  Patient is to be discharged home to follow-up with her primary care  provider as well as gastroenterology.  She will be prescribed Bentyl and Norco.  She was advised to return to the emergency department for any symptom that changes or worsens over the weekend if she is unable to schedule an appointment with her primary care provider.   INITIAL IMPRESSION / ASSESSMENT AND PLAN / ED COURSE  Pertinent labs & imaging results that were available during my care of the patient were reviewed by me and considered in my medical decision making (see chart for details).  ____________________________________________   FINAL CLINICAL IMPRESSION(S) / ED DIAGNOSES  Final diagnoses:  Left lower quadrant abdominal pain    Note:  This document was prepared using Dragon voice recognition software and may include unintentional dictation errors.    Chinita Pester, FNP 09/17/18 2351    Minna Antis, MD 09/18/18 2025

## 2018-09-17 NOTE — ED Triage Notes (Signed)
Pt c/o LUQ pain shooting in the left side since Tuesday with nausea. Pt states she thought it was just a UTI but is getting worse.

## 2018-09-17 NOTE — Discharge Instructions (Signed)
Please follow up with your primary care if not improving over the weekend.  Schedule an appointment with the stomach specialist.  Return to the ER for symptoms that change or worsen if unable to see primary care or the specialist.

## 2018-09-20 ENCOUNTER — Telehealth: Payer: Self-pay | Admitting: Gastroenterology

## 2018-09-20 LAB — URINE CULTURE: Culture: >=100000 — AB

## 2018-09-20 NOTE — Telephone Encounter (Signed)
LVM for pt to call office to discuss whether she wants to move up her office visit or go ahead and have her Flex Sig scheduled per Dr. Verdis Prime note.  Thanks [employer]

## 2018-09-20 NOTE — Telephone Encounter (Signed)
Pt is calling she went to Er last frrday for Acute Abdominal Pain pt is scheduled for apt to see Dr. Allegra Lai 03/06 but would like to come in sooner . Dr. Allegra Lai is on Call this week and has full schedule  Can I Overbook or keep apt for 09/24/18

## 2018-09-21 ENCOUNTER — Telehealth: Payer: Self-pay | Admitting: Gastroenterology

## 2018-09-21 NOTE — Telephone Encounter (Signed)
Pt is calling to reschedule her procedure to either 10/04/18 or 10/18/18 please call pt

## 2018-09-21 NOTE — Progress Notes (Signed)
Patient's urine returned positive for E.coli. Pan sensitive.  Called and spoke to patient that we will order antibiotics for her. Confirmed that she wanted the prescription filled at [facility] on [address] phone number [phone]. Called in Keflex 500 mg TID x 5 days # 15 0 refills. Case discussed with Vernard Gambles MD. Patient agreed to pick up medication from pharmacy.   Paschal Dopp, PharmD, BCPS.

## 2018-09-22 ENCOUNTER — Other Ambulatory Visit: Payer: Self-pay

## 2018-09-22 DIAGNOSIS — K625 Hemorrhage of anus and rectum: Secondary | ICD-10-CM

## 2018-09-22 NOTE — Telephone Encounter (Signed)
Patient states she was returning Tameka's call.

## 2018-09-22 NOTE — Telephone Encounter (Signed)
Spoke with pt and procedure has been rescheduled for 10/04/2018

## 2018-09-24 ENCOUNTER — Ambulatory Visit: Payer: Self-pay | Admitting: Gastroenterology

## 2018-10-01 ENCOUNTER — Encounter: Payer: Self-pay | Admitting: *Deleted

## 2018-10-04 ENCOUNTER — Encounter: Payer: Self-pay | Admitting: *Deleted

## 2018-10-04 ENCOUNTER — Ambulatory Visit: Payer: 59 | Admitting: Certified Registered Nurse Anesthetist

## 2018-10-04 ENCOUNTER — Other Ambulatory Visit: Payer: Self-pay

## 2018-10-04 ENCOUNTER — Encounter
Admission: RE | Disposition: A | Discharge status: Home / Self Care | Payer: Self-pay | Source: Home / Self Care | Attending: Gastroenterology

## 2018-10-04 ENCOUNTER — Ambulatory Visit
Admission: RE | Admit: 2018-10-04 | Discharge: 2018-10-04 | Disposition: A | Discharge status: Home / Self Care | Payer: 59 | Attending: Gastroenterology | Admitting: Gastroenterology

## 2018-10-04 DIAGNOSIS — Z791 Long term (current) use of non-steroidal anti-inflammatories (NSAID): Secondary | ICD-10-CM | POA: Insufficient documentation

## 2018-10-04 DIAGNOSIS — I10 Essential (primary) hypertension: Secondary | ICD-10-CM | POA: Insufficient documentation

## 2018-10-04 DIAGNOSIS — E7800 Pure hypercholesterolemia, unspecified: Secondary | ICD-10-CM | POA: Diagnosis not present

## 2018-10-04 DIAGNOSIS — Z87891 Personal history of nicotine dependence: Secondary | ICD-10-CM | POA: Diagnosis not present

## 2018-10-04 DIAGNOSIS — G935 Compression of brain: Secondary | ICD-10-CM | POA: Diagnosis not present

## 2018-10-04 DIAGNOSIS — F329 Major depressive disorder, single episode, unspecified: Secondary | ICD-10-CM | POA: Diagnosis not present

## 2018-10-04 DIAGNOSIS — Z7951 Long term (current) use of inhaled steroids: Secondary | ICD-10-CM | POA: Diagnosis not present

## 2018-10-04 DIAGNOSIS — K644 Residual hemorrhoidal skin tags: Secondary | ICD-10-CM | POA: Insufficient documentation

## 2018-10-04 DIAGNOSIS — Z79899 Other long term (current) drug therapy: Secondary | ICD-10-CM | POA: Diagnosis not present

## 2018-10-04 DIAGNOSIS — K625 Hemorrhage of anus and rectum: Secondary | ICD-10-CM | POA: Insufficient documentation

## 2018-10-04 DIAGNOSIS — M1990 Unspecified osteoarthritis, unspecified site: Secondary | ICD-10-CM | POA: Diagnosis not present

## 2018-10-04 DIAGNOSIS — Z7984 Long term (current) use of oral hypoglycemic drugs: Secondary | ICD-10-CM | POA: Diagnosis not present

## 2018-10-04 DIAGNOSIS — F419 Anxiety disorder, unspecified: Secondary | ICD-10-CM | POA: Diagnosis not present

## 2018-10-04 DIAGNOSIS — E119 Type 2 diabetes mellitus without complications: Secondary | ICD-10-CM | POA: Diagnosis not present

## 2018-10-04 HISTORY — PX: COLONOSCOPY WITH PROPOFOL: SHX5780

## 2018-10-04 LAB — GLUCOSE, CAPILLARY: Glucose-Capillary: 131 mg/dL — ABNORMAL HIGH (ref 70–99)

## 2018-10-04 SURGERY — COLONOSCOPY WITH PROPOFOL
Anesthesia: General

## 2018-10-04 MED ORDER — LIDOCAINE HCL (PF) 2 % IJ SOLN
INTRAMUSCULAR | Status: AC
  Filled 2018-10-04: qty 10

## 2018-10-04 MED ORDER — PROPOFOL 10 MG/ML IV BOLUS
INTRAVENOUS | Status: DC | PRN
  Administered 2018-10-04: 90 mg via INTRAVENOUS

## 2018-10-04 MED ORDER — PROPOFOL 500 MG/50ML IV EMUL
INTRAVENOUS | Status: DC | PRN
  Administered 2018-10-04: 100 ug/kg/min via INTRAVENOUS

## 2018-10-04 MED ORDER — LIDOCAINE HCL (CARDIAC) PF 100 MG/5ML IV SOSY
PREFILLED_SYRINGE | INTRAVENOUS | Status: DC | PRN
  Administered 2018-10-04: 100 mg via INTRAVENOUS

## 2018-10-04 MED ORDER — SODIUM CHLORIDE 0.9 % IV SOLN
INTRAVENOUS | Status: DC
  Administered 2018-10-04: 11:00:00 via INTRAVENOUS

## 2018-10-04 NOTE — Anesthesia Post-op Follow-up Note (Signed)
Anesthesia QCDR form completed.        

## 2018-10-04 NOTE — H&P (Signed)
Arlyss Repressohini R Tequlia Gonsalves, MD [address]  [address]  [place], [place] [address]  Main: [phone]  Fax: [phone] Pager: [phone]  Primary Care Physician:  Fayrene HelperBoswell, Chelsa H, NP Primary Gastroenterologist:  Dr. Arlyss Repressohini R Vinod Mikesell  Pre-Procedure History & Physical: HPI:  Christie BeckersDanielle Decicco is a 43 y.o. female is here for an colonoscopy.   Past Medical History:  Diagnosis Date  . Anxiety   . Arthritis   . Chiari malformation   . Depression   . Diabetes mellitus without complication (HCC)   . Diabetes mellitus, type II (HCC)   . High cholesterol   . Hypertension   . Mitral valve regurgitation     Past Surgical History:  Procedure Laterality Date  . ABDOMINAL HYSTERECTOMY    . APPENDECTOMY    . CESAREAN SECTION    . NOSE SURGERY    . STERNAL WIRE REMOVAL    . thromectomy      Prior to Admission medications   Medication Sig Start Date End Date Taking? Authorizing Provider  ALPRAZolam Prudy Feeler(XANAX) 1 MG tablet Take 1 mg by mouth.   Yes [provider]  atorvastatin (LIPITOR) 40 MG tablet atorvastatin 40 mg tablet   Yes [provider]  cetirizine (ZYRTEC) 10 MG tablet Take 10 mg by mouth.   Yes [provider]  dicyclomine (BENTYL) 20 MG tablet Take 1 tablet (20 mg total) by mouth 3 (three) times daily as needed for spasms. 09/17/18 09/17/19 Yes Triplett, Cari B, FNP  fluticasone (FLONASE) 50 MCG/ACT nasal spray fluticasone propionate 50 mcg/actuation nasal spray,suspension  instill 2 sprays into each nostril once daily   Yes [provider]  ketorolac (TORADOL) 10 MG tablet  06/21/14  Yes [provider]  Lancets (ONETOUCH DELICA PLUS LANCET33G) MISC USE TO CHECK SUGARS ONCE DAILY DX E11.9 06/03/18  Yes [provider]  metFORMIN (GLUCOPHAGE) 1000 MG tablet Take 500 mg by mouth 2 (two) times daily with a meal.   Yes [provider]  metoprolol succinate (TOPROL-XL) 25 MG 24 hr tablet Take 25 mg by mouth daily.   Yes  [provider]  [facility] test strip USE TO CHECK SUGARS DAILY 06/04/18  Yes [provider]  promethazine (PHENERGAN) 12.5 MG tablet Take 1 tablet (12.5 mg total) by mouth every 6 (six) hours as needed for nausea or vomiting. 03/01/18  Yes Willy Eddyobinson, Patrick, MD  spironolactone (ALDACTONE) 25 MG tablet Take 25 mg by mouth daily.   Yes [provider]  traZODone (DESYREL) 100 MG tablet trazodone 100 mg tablet 05/30/16  Yes [provider]  venlafaxine (EFFEXOR) 37.5 MG tablet Take by mouth.   Yes [provider]    Allergies as of 09/22/2018 - Review Complete 09/17/2018  Allergen Reaction Noted  . Ibuprofen  08/20/2018  . Nsaids Swelling 03/23/2014    Family History  Problem Relation Age of Onset  . Depression Mother   . Bipolar disorder Mother   . Alzheimer's disease Father   . Thyroid disease Brother     Social History   Socioeconomic History  . Marital status: Married    Spouse name: Not on file  . Number of children: Not on file  . Years of education: Not on file  . Highest education level: Not on file  Occupational History  . Not on file  Social Needs  . Financial resource strain: Not on file  . Food insecurity:    Worry: Not on file    Inability: Not on  file  . Transportation needs:    Medical: Not on file    Non-medical: Not on file  Tobacco Use  . Smoking status: Former Smoker    Packs/day: 0.50    Types: Cigarettes    Last attempt to quit: 07/21/2017    Years since quitting: 1.2  . Smokeless tobacco: Never Used  Substance and Sexual Activity  . Alcohol use: No  . Drug use: No  . Sexual activity: Yes  Lifestyle  . Physical activity:    Days per week: Not on file    Minutes per session: Not on file  . Stress: Not on file  Relationships  . Social connections:    Talks on phone: Not on file    Gets together: Not on file    Attends religious service: Not on file    Active member of club or organization:  Not on file    Attends meetings of clubs or organizations: Not on file    Relationship status: Not on file  . Intimate partner violence:    Fear of current or ex partner: Not on file    Emotionally abused: Not on file    Physically abused: Not on file    Forced sexual activity: Not on file  Other Topics Concern  . Not on file  Social History Narrative  . Not on file    Review of Systems: See HPI, otherwise negative ROS  Physical Exam: BP (!) 110/57   Pulse 72   Temp 98.3 F (36.8 C) (Oral)   Resp 15   Ht 5\' 3"  (1.6 m)   Wt 117.9 kg   SpO2 96%   BMI 46.06 kg/m  General:   Alert,  pleasant and cooperative in NAD Head:  Normocephalic and atraumatic. Neck:  Supple; no masses or thyromegaly. Lungs:  Clear throughout to auscultation.    Heart:  Regular rate and rhythm. Abdomen:  Soft, nontender and nondistended. Normal bowel sounds, without guarding, and without rebound.   Neurologic:  Alert and  oriented x4;  grossly normal neurologically.  Impression/Plan: Ileyah Napolitan is here for an colonoscopy to be performed for rectal bleeding  Risks, benefits, limitations, and alternatives regarding  colonoscopy have been reviewed with the patient.  Questions have been answered.  All parties agreeable.   Lannette Donath, MD  10/04/2018, 11:49 AM

## 2018-10-04 NOTE — Transfer of Care (Signed)
Immediate Anesthesia Transfer of Care Note  Patient: Amanda English  Procedure(s) Performed: COLONOSCOPY WITH PROPOFOL (N/A )  Patient Location: PACU and Endoscopy Unit  Anesthesia Type:General  Level of Consciousness: awake, oriented, drowsy and patient cooperative  Airway & Oxygen Therapy: Patient Spontanous Breathing  Post-op Assessment: Report given to RN, Post -op Vital signs reviewed and stable and Patient moving all extremities  Post vital signs: Reviewed and stable  Last Vitals:  Vitals Value Taken Time  BP 88/53 10/04/2018 12:20 PM  Temp    Pulse 64 10/04/2018 12:21 PM  Resp 17 10/04/2018 12:21 PM  SpO2 99 % 10/04/2018 12:21 PM  Vitals shown include unvalidated device data.  Last Pain:  Vitals:   10/04/18 1220  TempSrc: (P) Tympanic         Complications: No apparent anesthesia complications

## 2018-10-04 NOTE — Anesthesia Postprocedure Evaluation (Signed)
Anesthesia Post Note  Patient: [name]  Procedure(s) Performed: COLONOSCOPY WITH PROPOFOL (N/A )  Patient location during evaluation: PACU Anesthesia Type: General Level of consciousness: awake and alert Pain management: pain level controlled Vital Signs Assessment: post-procedure vital signs reviewed and stable Respiratory status: spontaneous breathing, nonlabored ventilation and respiratory function stable Cardiovascular status: blood pressure returned to baseline and stable Postop Assessment: no apparent nausea or vomiting Anesthetic complications: no     Last Vitals:  Vitals:   10/04/18 1017 10/04/18 1220  BP: (!) 110/57 (!) 88/53  Pulse: 72 65  Resp: 15 15  Temp: 36.8 C (!) 36.3 C  SpO2: 96% 100%    Last Pain:  Vitals:   10/04/18 1220  TempSrc: Tympanic                 Jovita Gamma

## 2018-10-04 NOTE — Op Note (Signed)
[facility] Gastroenterology Patient Name: Amanda English Procedure Date: 10/04/2018 11:55 AM MRN: [id number] Account #: [id number] Date of Birth: Apr 18, 1976 Admit Type: Outpatient Age: 43 Room: [facility] ENDO ROOM 3 Gender: Female Note Status: Finalized Procedure:            Colonoscopy Indications:          Rectal bleeding Providers:            Lin Landsman MD, MD Referring MD:         Evern Bio, FNP Medicines:            Monitored Anesthesia Care Complications:        No immediate complications. Estimated blood loss: None. Procedure:            Pre-Anesthesia Assessment:                       - Prior to the procedure, a History and Physical was                        performed, and patient medications and allergies were                        reviewed. The patient is competent. The risks and                        benefits of the procedure and the sedation options and                        risks were discussed with the patient. All questions                        were answered and informed consent was obtained.                        Patient identification and proposed procedure were                        verified by the physician, the nurse, the                        anesthesiologist, the anesthetist and the technician in                        the pre-procedure area in the procedure room in the                        endoscopy suite. Mental Status Examination: alert and                        oriented. Airway Examination: normal oropharyngeal                        airway and neck mobility. Respiratory Examination:                        clear to auscultation. CV Examination: normal.                        Prophylactic Antibiotics: The patient does not require  prophylactic antibiotics. Prior Anticoagulants: The                        patient has taken no previous anticoagulant or                        antiplatelet agents.  ASA Grade Assessment: III - A                        patient with severe systemic disease. After reviewing                        the risks and benefits, the patient was deemed in                        satisfactory condition to undergo the procedure. The                        anesthesia plan was to use monitored anesthesia care                        (MAC). Immediately prior to administration of                        medications, the patient was re-assessed for adequacy                        to receive sedatives. The heart rate, respiratory rate,                        oxygen saturations, blood pressure, adequacy of                        pulmonary ventilation, and response to care were                        monitored throughout the procedure. The physical status                        of the patient was re-assessed after the procedure.                       After obtaining informed consent, the colonoscope was                        passed under direct vision. Throughout the procedure,                        the patient's blood pressure, pulse, and oxygen                        saturations were monitored continuously. The                        Colonoscope was introduced through the anus and                        advanced to the the cecum, identified by appendiceal  orifice and ileocecal valve. The colonoscopy was                        performed without difficulty. The patient tolerated the                        procedure well. The quality of the bowel preparation                        was evaluated using the BBPS [facility] Bowel Preparation                        Scale) with scores of: Right Colon = 3, Transverse                        Colon = 3 and Left Colon = 3 (entire mucosa seen well                        with no residual staining, small fragments of stool or                        opaque liquid). The total BBPS score equals 9. Findings:      The  perianal and digital rectal examinations were normal. Pertinent       negatives include normal sphincter tone and no palpable rectal lesions.      The entire examined colon appeared normal.      Non-bleeding external hemorrhoids were found during retroflexion. The       hemorrhoids were small. Impression:           - The entire examined colon is normal.                       - Non-bleeding external hemorrhoids.                       - No specimens collected. Recommendation:       - Discharge patient to home (with escort).                       - Resume previous diet today.                       - Continue present medications.                       - Repeat colonoscopy in 10 years for surveillance.                       - Return to my office PRN. Procedure Code(s):    --- Professional ---                       [phone], Colonoscopy, flexible; diagnostic, including                        collection of specimen(s) by brushing or washing, when                        performed (separate procedure) Diagnosis Code(s):    --- Professional ---  K64.4, Residual hemorrhoidal skin tags                       K62.5, Hemorrhage of anus and rectum CPT copyright 2018 American Medical Association. All rights reserved. The codes documented in this report are preliminary and upon coder review may  be revised to meet current compliance requirements. Dr. Ulyess Mort Lin Landsman MD, MD 10/04/2018 12:16:38 PM This report has been signed electronically. Number of Addenda: 0 Note Initiated On: 10/04/2018 11:55 AM Scope Withdrawal Time: 0 hours 7 minutes 58 seconds  Total Procedure Duration: 0 hours 10 minutes 53 seconds       [facility]

## 2018-10-04 NOTE — Anesthesia Preprocedure Evaluation (Addendum)
Anesthesia Evaluation  Patient identified by MRN, date of birth, ID band Patient awake    Reviewed: Allergy & Precautions, H&P , NPO status , Patient's Chart, lab work & pertinent test results  Airway Mallampati: III  TM Distance: >3 FB     Dental  (+) Teeth Intact   Pulmonary neg COPD, neg recent URI, former smoker,           Cardiovascular hypertension, (-) angina(-) Past MI and (-) Cardiac Stents (-) dysrhythmias      Neuro/Psych  Headaches, PSYCHIATRIC DISORDERS Anxiety Depression Arnold-Chiari malformation    GI/Hepatic negative GI ROS, Neg liver ROS,   Endo/Other  negative endocrine ROSdiabetes  Renal/GU negative Renal ROS  negative genitourinary   Musculoskeletal  (+) Arthritis , Rheumatoid disorders,  Fibromyalgia -  Abdominal   Peds  Hematology negative hematology ROS (+)   Anesthesia Other Findings Past Medical History: No date: Anxiety No date: Arthritis No date: Chiari malformation No date: Depression No date: Diabetes mellitus without complication (HCC) No date: Diabetes mellitus, type II (HCC) No date: High cholesterol No date: Hypertension No date: Mitral valve regurgitation  Past Surgical History: No date: ABDOMINAL HYSTERECTOMY No date: APPENDECTOMY No date: CESAREAN SECTION No date: NOSE SURGERY No date: STERNAL WIRE REMOVAL No date: thromectomy  BMI    Body Mass Index:  46.06 kg/m      Reproductive/Obstetrics negative OB ROS                            Anesthesia Physical Anesthesia Plan  ASA: III  Anesthesia Plan: General   Post-op Pain Management:    Induction:   PONV Risk Score and Plan: Propofol infusion and TIVA  Airway Management Planned: Nasal Cannula  Additional Equipment:   Intra-op Plan:   Post-operative Plan:   Informed Consent: I have reviewed the patients History and Physical, chart, labs and discussed the procedure including  the risks, benefits and alternatives for the proposed anesthesia with the patient or authorized representative who has indicated his/her understanding and acceptance.     Dental Advisory Given  Plan Discussed with: Anesthesiologist and CRNA  Anesthesia Plan Comments:         Anesthesia Quick Evaluation

## 2019-01-17 ENCOUNTER — Other Ambulatory Visit: Payer: Self-pay

## 2019-01-17 ENCOUNTER — Telehealth: Payer: Self-pay | Admitting: *Deleted

## 2019-01-17 DIAGNOSIS — Z20822 Contact with and (suspected) exposure to covid-19: Secondary | ICD-10-CM

## 2019-01-17 NOTE — Telephone Encounter (Signed)
[facility] request COVID testing  Symptomatic

## 2019-01-20 LAB — NOVEL CORONAVIRUS, NAA: SARS-CoV-2, NAA: NOT DETECTED

## 2019-03-01 DIAGNOSIS — M1712 Unilateral primary osteoarthritis, left knee: Secondary | ICD-10-CM | POA: Insufficient documentation

## 2019-04-26 ENCOUNTER — Other Ambulatory Visit: Payer: Self-pay

## 2019-04-26 DIAGNOSIS — Z20822 Contact with and (suspected) exposure to covid-19: Secondary | ICD-10-CM

## 2019-04-26 DIAGNOSIS — Z20828 Contact with and (suspected) exposure to other viral communicable diseases: Secondary | ICD-10-CM

## 2019-04-28 LAB — NOVEL CORONAVIRUS, NAA: SARS-CoV-2, NAA: NOT DETECTED

## 2019-09-28 ENCOUNTER — Other Ambulatory Visit: Payer: Self-pay | Admitting: Internal Medicine

## 2019-09-28 DIAGNOSIS — M7989 Other specified soft tissue disorders: Secondary | ICD-10-CM

## 2019-09-28 DIAGNOSIS — R600 Localized edema: Secondary | ICD-10-CM

## 2019-10-04 ENCOUNTER — Ambulatory Visit
Admission: RE | Admit: 2019-10-04 | Discharge: 2019-10-04 | Disposition: A | Discharge status: Home / Self Care | Payer: 59 | Source: Ambulatory Visit | Attending: Internal Medicine | Admitting: Internal Medicine

## 2019-10-04 ENCOUNTER — Other Ambulatory Visit: Payer: Self-pay

## 2019-10-04 DIAGNOSIS — R600 Localized edema: Secondary | ICD-10-CM | POA: Diagnosis present

## 2019-10-04 DIAGNOSIS — M7989 Other specified soft tissue disorders: Secondary | ICD-10-CM | POA: Diagnosis present

## 2019-10-13 ENCOUNTER — Ambulatory Visit: Admit: 2019-10-13 | Payer: 59 | Admitting: Otolaryngology

## 2019-10-13 ENCOUNTER — Other Ambulatory Visit: Payer: Self-pay | Admitting: Internal Medicine

## 2019-10-13 DIAGNOSIS — Z1231 Encounter for screening mammogram for malignant neoplasm of breast: Secondary | ICD-10-CM

## 2019-10-13 SURGERY — SINUS SURGERY, WITH IMAGING GUIDANCE
Anesthesia: General | Laterality: Bilateral

## 2019-11-03 ENCOUNTER — Ambulatory Visit
Admission: RE | Admit: 2019-11-03 | Discharge: 2019-11-03 | Disposition: A | Discharge status: Home / Self Care | Payer: 59 | Source: Ambulatory Visit | Attending: Internal Medicine | Admitting: Internal Medicine

## 2019-11-03 ENCOUNTER — Other Ambulatory Visit: Payer: Self-pay

## 2019-11-03 ENCOUNTER — Other Ambulatory Visit: Payer: Self-pay | Admitting: Internal Medicine

## 2019-11-03 DIAGNOSIS — N10 Acute tubulo-interstitial nephritis: Secondary | ICD-10-CM | POA: Diagnosis present

## 2019-11-10 ENCOUNTER — Encounter: Payer: Self-pay | Admitting: Otolaryngology

## 2019-11-10 ENCOUNTER — Other Ambulatory Visit: Payer: Self-pay

## 2019-11-15 ENCOUNTER — Other Ambulatory Visit: Payer: Self-pay

## 2019-11-15 ENCOUNTER — Other Ambulatory Visit
Admission: RE | Admit: 2019-11-15 | Discharge: 2019-11-15 | Disposition: A | Discharge status: Home / Self Care | Payer: 59 | Source: Ambulatory Visit | Attending: Otolaryngology | Admitting: Otolaryngology

## 2019-11-15 DIAGNOSIS — Z20822 Contact with and (suspected) exposure to covid-19: Secondary | ICD-10-CM | POA: Insufficient documentation

## 2019-11-15 DIAGNOSIS — Z01812 Encounter for preprocedural laboratory examination: Secondary | ICD-10-CM | POA: Insufficient documentation

## 2019-11-15 LAB — SARS CORONAVIRUS 2 (TAT 6-24 HRS): SARS Coronavirus 2: NEGATIVE

## 2019-11-17 ENCOUNTER — Ambulatory Visit: Payer: 59 | Admitting: Anesthesiology

## 2019-11-17 ENCOUNTER — Ambulatory Visit
Admission: RE | Admit: 2019-11-17 | Discharge: 2019-11-17 | Disposition: A | Discharge status: Home / Self Care | Payer: 59 | Attending: Otolaryngology | Admitting: Otolaryngology

## 2019-11-17 ENCOUNTER — Other Ambulatory Visit: Payer: Self-pay

## 2019-11-17 ENCOUNTER — Encounter
Admission: RE | Disposition: A | Discharge status: Home / Self Care | Payer: Self-pay | Source: Home / Self Care | Attending: Otolaryngology

## 2019-11-17 ENCOUNTER — Encounter: Payer: Self-pay | Admitting: Otolaryngology

## 2019-11-17 DIAGNOSIS — J322 Chronic ethmoidal sinusitis: Secondary | ICD-10-CM | POA: Insufficient documentation

## 2019-11-17 DIAGNOSIS — F329 Major depressive disorder, single episode, unspecified: Secondary | ICD-10-CM | POA: Insufficient documentation

## 2019-11-17 DIAGNOSIS — I10 Essential (primary) hypertension: Secondary | ICD-10-CM | POA: Diagnosis not present

## 2019-11-17 DIAGNOSIS — Z7984 Long term (current) use of oral hypoglycemic drugs: Secondary | ICD-10-CM | POA: Diagnosis not present

## 2019-11-17 DIAGNOSIS — J320 Chronic maxillary sinusitis: Secondary | ICD-10-CM | POA: Diagnosis not present

## 2019-11-17 DIAGNOSIS — E119 Type 2 diabetes mellitus without complications: Secondary | ICD-10-CM | POA: Diagnosis not present

## 2019-11-17 DIAGNOSIS — J343 Hypertrophy of nasal turbinates: Secondary | ICD-10-CM | POA: Insufficient documentation

## 2019-11-17 DIAGNOSIS — Z6841 Body Mass Index (BMI) 40.0 and over, adult: Secondary | ICD-10-CM | POA: Insufficient documentation

## 2019-11-17 DIAGNOSIS — Z79899 Other long term (current) drug therapy: Secondary | ICD-10-CM | POA: Diagnosis not present

## 2019-11-17 DIAGNOSIS — Z87891 Personal history of nicotine dependence: Secondary | ICD-10-CM | POA: Diagnosis not present

## 2019-11-17 DIAGNOSIS — J321 Chronic frontal sinusitis: Secondary | ICD-10-CM | POA: Insufficient documentation

## 2019-11-17 DIAGNOSIS — F419 Anxiety disorder, unspecified: Secondary | ICD-10-CM | POA: Diagnosis not present

## 2019-11-17 HISTORY — DX: Fatty (change of) liver, not elsewhere classified: K76.0

## 2019-11-17 HISTORY — DX: Other complications of anesthesia, initial encounter: T88.59XA

## 2019-11-17 HISTORY — PX: MAXILLARY ANTROSTOMY: SHX2003

## 2019-11-17 HISTORY — DX: Migraine, unspecified, not intractable, without status migrainosus: G43.909

## 2019-11-17 HISTORY — DX: Gastro-esophageal reflux disease without esophagitis: K21.9

## 2019-11-17 HISTORY — PX: TURBINATE REDUCTION: SHX6157

## 2019-11-17 HISTORY — PX: ETHMOIDECTOMY: SHX5197

## 2019-11-17 HISTORY — PX: IMAGE GUIDED SINUS SURGERY: SHX6570

## 2019-11-17 HISTORY — DX: Spina bifida occulta: Q76.0

## 2019-11-17 HISTORY — DX: Family history of other specified conditions: Z84.89

## 2019-11-17 LAB — GLUCOSE, CAPILLARY
Glucose-Capillary: 154 mg/dL — ABNORMAL HIGH (ref 70–99)
Glucose-Capillary: 121 mg/dL — ABNORMAL HIGH (ref 70–99)

## 2019-11-17 SURGERY — SINUS SURGERY, WITH IMAGING GUIDANCE
Anesthesia: General | Site: Nose | Laterality: Bilateral

## 2019-11-17 MED ORDER — OXYMETAZOLINE HCL 0.05 % NA SOLN
2.0000 | Freq: Once | NASAL | Status: AC
  Administered 2019-11-17: 2 via NASAL

## 2019-11-17 MED ORDER — SUCCINYLCHOLINE CHLORIDE 20 MG/ML IJ SOLN
INTRAMUSCULAR | Status: DC | PRN
  Administered 2019-11-17: 100 mg via INTRAVENOUS
  Administered 2019-11-17: 60 mg via INTRAVENOUS

## 2019-11-17 MED ORDER — HYDROCODONE-ACETAMINOPHEN 5-325 MG PO TABS: 1.0000 | ORAL_TABLET | Freq: Four times a day (QID) | ORAL | 0 refills | Status: AC | PRN

## 2019-11-17 MED ORDER — FENTANYL CITRATE (PF) 100 MCG/2ML IJ SOLN
INTRAMUSCULAR | Status: DC | PRN
  Administered 2019-11-17: 50 ug via INTRAVENOUS

## 2019-11-17 MED ORDER — LIDOCAINE-EPINEPHRINE 1 %-1:100000 IJ SOLN
INTRAMUSCULAR | Status: DC | PRN
  Administered 2019-11-17: 3 mL

## 2019-11-17 MED ORDER — GLYCOPYRROLATE 0.2 MG/ML IJ SOLN
INTRAMUSCULAR | Status: DC | PRN
  Administered 2019-11-17: .1 mg via INTRAVENOUS

## 2019-11-17 MED ORDER — LACTATED RINGERS IV SOLN
100.0000 mL/h | INTRAVENOUS | Status: DC
  Administered 2019-11-17: 100 mL/h via INTRAVENOUS

## 2019-11-17 MED ORDER — SCOPOLAMINE 1 MG/3DAYS TD PT72
1.0000 | MEDICATED_PATCH | Freq: Once | TRANSDERMAL | Status: DC
  Administered 2019-11-17: 1.5 mg via TRANSDERMAL

## 2019-11-17 MED ORDER — ACETAMINOPHEN 10 MG/ML IV SOLN
1000.0000 mg | Freq: Once | INTRAVENOUS | Status: AC
  Administered 2019-11-17: 1000 mg via INTRAVENOUS

## 2019-11-17 MED ORDER — FENTANYL CITRATE (PF) 100 MCG/2ML IJ SOLN: 25.0000 ug | INTRAMUSCULAR | Status: DC | PRN

## 2019-11-17 MED ORDER — ONDANSETRON HCL 4 MG/2ML IJ SOLN: 4.0000 mg | Freq: Once | INTRAMUSCULAR | Status: DC | PRN

## 2019-11-17 MED ORDER — AMISULPRIDE (ANTIEMETIC) 5 MG/2ML IV SOLN
10.0000 mg | Freq: Once | INTRAVENOUS | Status: AC
  Administered 2019-11-17: 10 mg via INTRAVENOUS

## 2019-11-17 MED ORDER — CEPHALEXIN 500 MG PO CAPS: 500.0000 mg | ORAL_CAPSULE | Freq: Two times a day (BID) | ORAL | 0 refills | Status: DC

## 2019-11-17 MED ORDER — LIDOCAINE HCL (CARDIAC) PF 100 MG/5ML IV SOSY
PREFILLED_SYRINGE | INTRAVENOUS | Status: DC | PRN
  Administered 2019-11-17: 30 mg via INTRAVENOUS

## 2019-11-17 MED ORDER — CEFAZOLIN SODIUM-DEXTROSE 2-4 GM/100ML-% IV SOLN
2.0000 g | Freq: Once | INTRAVENOUS | Status: AC
  Administered 2019-11-17: 2 g via INTRAVENOUS

## 2019-11-17 MED ORDER — PROPOFOL 10 MG/ML IV BOLUS
INTRAVENOUS | Status: DC | PRN
  Administered 2019-11-17: 200 mg via INTRAVENOUS
  Administered 2019-11-17: 40 mg via INTRAVENOUS

## 2019-11-17 MED ORDER — ROCURONIUM BROMIDE 100 MG/10ML IV SOLN
INTRAVENOUS | Status: DC | PRN
  Administered 2019-11-17: 30 mg via INTRAVENOUS

## 2019-11-17 MED ORDER — PHENYLEPHRINE HCL 0.5 % NA SOLN
NASAL | Status: DC | PRN
  Administered 2019-11-17: 10:00:00 15 mL via TOPICAL

## 2019-11-17 MED ORDER — PREDNISONE 10 MG PO TABS
ORAL_TABLET | ORAL | 0 refills | Status: DC
Start: 2019-11-17 — End: 2020-06-08

## 2019-11-17 MED ORDER — OXYCODONE HCL 5 MG PO TABS: 5.0000 mg | ORAL_TABLET | Freq: Once | ORAL | Status: AC | PRN

## 2019-11-17 MED ORDER — MIDAZOLAM HCL 5 MG/5ML IJ SOLN
INTRAMUSCULAR | Status: DC | PRN
  Administered 2019-11-17: 2 mg via INTRAVENOUS

## 2019-11-17 MED ORDER — DEXAMETHASONE SODIUM PHOSPHATE 4 MG/ML IJ SOLN
INTRAMUSCULAR | Status: DC | PRN
  Administered 2019-11-17: 4 mg via INTRAVENOUS

## 2019-11-17 MED ORDER — ONDANSETRON HCL 4 MG/2ML IJ SOLN
INTRAMUSCULAR | Status: DC | PRN
  Administered 2019-11-17: 4 mg via INTRAVENOUS

## 2019-11-17 MED ORDER — SUGAMMADEX SODIUM 200 MG/2ML IV SOLN
INTRAVENOUS | Status: DC | PRN
  Administered 2019-11-17 (×2): 200 mg via INTRAVENOUS

## 2019-11-17 MED ORDER — OXYCODONE HCL 5 MG/5ML PO SOLN
5.0000 mg | Freq: Once | ORAL | Status: AC | PRN
  Administered 2019-11-17: 5 mg via ORAL

## 2019-11-17 SURGICAL SUPPLY — 32 items
BATTERY INSTRU NAVIGATION (MISCELLANEOUS) ×9 IMPLANT
CANISTER SUCT 1200ML W/VALVE (MISCELLANEOUS) ×3 IMPLANT
CATH IV 18X1 1/4 SAFELET (CATHETERS) ×3 IMPLANT
COAGULATOR SUCT 8FR VV (MISCELLANEOUS) ×3 IMPLANT
ELECT REM PT RETURN 9FT ADLT (ELECTROSURGICAL) ×3
ELECTRODE REM PT RTRN 9FT ADLT (ELECTROSURGICAL) ×1 IMPLANT
GLOVE PI ULTRA LF STRL 7.5 (GLOVE) ×2 IMPLANT
GLOVE PI ULTRA NON LATEX 7.5 (GLOVE) ×4
GOWN STRL REUS W/ TWL LRG LVL3 (GOWN DISPOSABLE) ×1 IMPLANT
GOWN STRL REUS W/TWL LRG LVL3 (GOWN DISPOSABLE) ×2
IV CATH 18X1 1/4 SAFELET (CATHETERS) ×1
IV NS 500ML (IV SOLUTION) ×2
IV NS 500ML BAXH (IV SOLUTION) ×1 IMPLANT
KIT TURNOVER KIT A (KITS) ×3 IMPLANT
NDL ANESTHESIA 27G X 3.5 (NEEDLE) ×1 IMPLANT
NDL HYPO 27GX1-1/4 (NEEDLE) ×1 IMPLANT
NEEDLE ANESTHESIA  27G X 3.5 (NEEDLE) ×2
NEEDLE ANESTHESIA 27G X 3.5 (NEEDLE) ×1 IMPLANT
NEEDLE HYPO 27GX1-1/4 (NEEDLE) ×3 IMPLANT
NS IRRIG 500ML POUR BTL (IV SOLUTION) ×3 IMPLANT
PACK ENT CUSTOM (PACKS) ×3 IMPLANT
PACKING NASAL EPIS 4X2.4 XEROG (MISCELLANEOUS) ×4 IMPLANT
PATTIES SURGICAL .5 X3 (DISPOSABLE) ×3 IMPLANT
SHAVER DIEGO BLD STD TYPE A (BLADE) ×2 IMPLANT
SOL ANTI-FOG 6CC FOG-OUT (MISCELLANEOUS) ×1 IMPLANT
SOL FOG-OUT ANTI-FOG 6CC (MISCELLANEOUS) ×2
STRAP BODY AND KNEE 60X3 (MISCELLANEOUS) ×3 IMPLANT
SYR 3ML LL SCALE MARK (SYRINGE) ×3 IMPLANT
TOWEL OR 17X26 4PK STRL BLUE (TOWEL DISPOSABLE) ×3 IMPLANT
TRACKER CRANIALMASK (MASK) ×3 IMPLANT
TUBING DECLOG MULTIDEBRIDER (TUBING) ×3 IMPLANT
WATER STERILE IRR 250ML POUR (IV SOLUTION) ×3 IMPLANT

## 2019-11-17 NOTE — Anesthesia Preprocedure Evaluation (Signed)
Anesthesia Evaluation  Patient identified by MRN, date of birth, ID band Patient awake    Reviewed: Allergy & Precautions, H&P , NPO status , Patient's Chart, lab work & pertinent test results  Airway Mallampati: III  TM Distance: >3 FB Neck ROM: full    Dental no notable dental hx.    Pulmonary Patient abstained from smoking., former smoker,    Pulmonary exam normal breath sounds clear to auscultation       Cardiovascular hypertension, Normal cardiovascular exam Rhythm:regular Rate:Normal     Neuro/Psych  Headaches, Anxiety Depression    GI/Hepatic GERD  ,Fatty Liver   Endo/Other  diabetes, Type obesity  Renal/GU      Musculoskeletal  (+) Arthritis , Rheumatoid disorders,    Abdominal   Peds  Hematology   Anesthesia Other Findings   Reproductive/Obstetrics                             Anesthesia Physical Anesthesia Plan  ASA: III  Anesthesia Plan: General ETT   Post-op Pain Management:    Induction:   PONV Risk Score and Plan: 3 and Treatment may vary due to age or medical condition, Ondansetron, Dexamethasone, Scopolamine patch - Pre-op and Midazolam  Airway Management Planned:   Additional Equipment:   Intra-op Plan:   Post-operative Plan:   Informed Consent: I have reviewed the patients History and Physical, chart, labs and discussed the procedure including the risks, benefits and alternatives for the proposed anesthesia with the patient or authorized representative who has indicated his/her understanding and acceptance.     Dental Advisory Given  Plan Discussed with: CRNA  Anesthesia Plan Comments:         Anesthesia Quick Evaluation

## 2019-11-17 NOTE — Anesthesia Procedure Notes (Signed)
Procedure Name: Intubation Date/Time: 11/17/2019 9:44 AM Performed by: Maree Krabbe, CRNA Pre-anesthesia Checklist: Patient identified, Emergency Drugs available, Suction available, Patient being monitored and Timeout performed Patient Re-evaluated:Patient Re-evaluated prior to induction Oxygen Delivery Method: Circle system utilized Preoxygenation: Pre-oxygenation with 100% oxygen Induction Type: IV induction Ventilation: Mask ventilation without difficulty Laryngoscope Size: Glidescope and 3 Grade View: Grade I Tube type: Oral Rae Tube size: 7.5 mm Number of attempts: 2 Placement Confirmation: ETT inserted through vocal cords under direct vision,  positive ETCO2 and breath sounds checked- equal and bilateral Tube secured with: Tape Dental Injury: Teeth and Oropharynx as per pre-operative assessment

## 2019-11-17 NOTE — Op Note (Signed)
11/17/2019  11:30 AM    Amanda English  [id number]   Pre-Op Dx: Chronic bilateral ethmoid sinusitis, chronic bilateral frontal sinusitis, chronic bilateral maxillary sinusitis, inferior turbinate hypertrophy  Post-op Dx: Same  Proc: Bilateral endoscopic total ethmoidectomy with frontal sinusotomy, bilateral endoscopic maxillary antrostomies, bilateral partial reduction of the inferior turbinates, use of image guided system  Surg:  Amanda English  Anes:  GOT  EBL: 100 mL  Comp: None  Findings: Thickened and inflamed mucous membranes throughout the ethmoids on both sides and at the maxillary antrums.  There is very narrow frontal sinus ducts that were widened significantly.  Procedure: The patient was brought to the operating room and placed in the supine position.  She was given general anesthesia by oral endotracheal intubation.  Once the patient was asleep the nose was prepped using cotton pledgets soaked in phenylephrine and Xylocaine as per routine.  6 pledgets were placed in the nose.  The image guided system was brought in and the CT scan was downloaded to the system.  The template was applied the face and this was registered to the system as well.  There was point 8 mm of variance.  There was good alignment anteriorly but posteriorly was off by about 1/4 inch.  The patient was reregistered again with point 8 mm of variance.  This time there was good alignment with the instruments and the CT scan.  The patient was then prepped and draped in a sterile fashion.  The 0 degree scope was used first to visualize both sides of the nose.  The septum was relatively straight.  The inferior turbinates were enlarged.  The middle meatus did not have any purulence there but just the thick clear mucus on both sides.  Local anesthesia was placed at the anterior and posterior roots of the ethmoid sinus and along the uncinate process.  Approximately 1-1/2 mL of 1% Xylocaine with epi 1: 100,000 was  used for infiltration on each side of the nose.  The left side was addressed first.  The middle turbinate was infractured and the middle meatus was visualized.  There is some mucous membrane thickening of overlying the ethmoid bulla.  A side biter was used to incise the uncinate process and the uncinate was completely removed using 45 degree through biting forceps and the microdebrider.  Once the natural ostium was found to the maxillary sinus then this was widened using through biting forceps and the microdebrider.  There is very thickened mucous membranes at the opening and around the uncinate process.  There is no sign of purulence in the sinus and there is no swelling at the base of the maxillary sinus.  30 degrees scopes were used to visualize these areas and make sure the natural ostium was widely open.  The 0 degree scope was used then to open up the ethmoid bulla and some of the middle and posterior ethmoid air cells.  The image guided system was used for evaluating the depth of dissection to make sure that all of the air cells were open.  The microdebrider was used to clear up the rough edges and remove thickened mucous membranes sharply.  There was a mucous membrane at the base of the sinus and the fovea ethmoidalis that was left intact.  The 30 degree scope was used again to visualize the anterior ethmoid air cells and these were cleaned out with the angled forceps and the microdebrider.  The frontal sinus duct was found and this was widened  using the frontal sinus instruments.  A 45 degree through biting boss frontal sinus instrument was used to help open up the frontal duct.  A backbiting frontal sinus Kerrison was used to help remove some of the party wall from the sinus and the agar nasi cell.  This left a wide open frontal sinus duct that was mucosally lined and the rest of the anterior cells were open.  The 03 degree scope was used to look around the area to make sure that these were completely  cleaned and no disease was left.  A cottonoid pledget was placed here temporarily for vasoconstriction while the other side was addressed.  The 0 degree scope was used to visualize the right side and again the middle turbinate was infractured.  This uncinate process was removed using a side biter to cut it first and then 45 degree through biting forceps for removal along with the microdebrider.  The 30 degree scope was used for visualizing the maxillary antrum and make sure the natural ostium was opened and widened posteriorly and inferiorly.  This was done with backbiting forceps along with the microdebrider.  Once the maxillary antrum was opened the 0 degree scope was used again for opening the ethmoid bulla and the middle and posterior ethmoid air cells.  The image guided system was used to visualize the depth of dissection to make sure that all the air cells were open.  These were clean microdebrider to make sure there were no rough edges and there was mucosal lining still on the roof of the ethmoid.  The 30 degree scope was then used for visualizing the anterior ethmoid air cells and these were opened widely.  The image guided system was used to make sure all the sinuses were open.  Frontal sinus duct was found and was opened using the frontal sinus instruments.  Backbiting instrument helped open this.  The opening was medial here on her right frontal sinus as it was on the left frontal sinus.  Once the frontal sinus duct was widely open to use the image guided system and slide up into the frontal sinus on either side.  The remaining anterior ethmoid air cells were cleaned out to make sure there is no hidden disease left.  The sinuses were all looked at again with the 0 and 30 degrees scope and there is no further disease here.  A cottonoid pledget was placed on the right side into the sinus for vasoconstriction.  The inferior turbinates were enlarged and these were trimmed using microdebrider along some of  their medial and inferior border.  A [place] elevator was used to outfracture them on both sides to help create a larger airway as well.  The cottonoid pledget was removed on the left side from the sinuses this was opened and clear.  There is no unusual bleeding.  Xerogel was placed into the ethmoid sinus to cover over the frontal sinus opening.  This was all wetted to help with hemostasis and preventing clots.  The right side was then revisualized with the cotton pledget removed and again the sinuses were clean and open with minimal bleeding.  Xerogel was placed in the posterior and then anterior ethmoid air cells as well like the other side.  It was wetted and the gel was covering the areas and helping prevent bleeding.  The patient tolerated the procedure well she was awakened and taken to the recovery room in satisfactory condition there were no operative complications.  There is  a good open airway on both sides.  Disposition: The patient was taken to the PACU to be discharged home.  Plan: She will rest at home with her head elevated.  She will follow-up in the office in 5 days for reevaluation of the wound.  To be on a small prednisone taper along with some antibiotics postop and some Norco for pain if needed.  She will call the office if she has any problems that need to be addressed sooner than her postop visit.  Amanda English  11/17/2019 11:30 AM

## 2019-11-17 NOTE — Transfer of Care (Signed)
Immediate Anesthesia Transfer of Care Note  Patient: Amanda English  Procedure(s) Performed: IMAGE GUIDED SINUS SURGERY (Bilateral Nose) MAXILLARY ANTROSTOMY (Bilateral Nose) TOTAL ETHMOIDECTOMY WITH FRONTAL SINUOTOMY (Bilateral Nose) TURBINATE REDUCTION (Bilateral Nose)  Patient Location: PACU  Anesthesia Type: General ETT  Level of Consciousness: awake, alert  and patient cooperative  Airway and Oxygen Therapy: Patient Spontanous Breathing and Patient connected to supplemental oxygen  Post-op Assessment: Post-op Vital signs reviewed, Patient's Cardiovascular Status Stable, Respiratory Function Stable, Patent Airway and No signs of Nausea or vomiting  Post-op Vital Signs: Reviewed and stable  Complications: No apparent anesthesia complications

## 2019-11-17 NOTE — Anesthesia Postprocedure Evaluation (Signed)
Anesthesia Post Note  Patient: Amanda English  Procedure(s) Performed: IMAGE GUIDED SINUS SURGERY (Bilateral Nose) MAXILLARY ANTROSTOMY (Bilateral Nose) TOTAL ETHMOIDECTOMY WITH FRONTAL SINUOTOMY (Bilateral Nose) TURBINATE REDUCTION (Bilateral Nose)     Patient location during evaluation: PACU Anesthesia Type: General Level of consciousness: awake and alert and oriented Pain management: satisfactory to patient Vital Signs Assessment: post-procedure vital signs reviewed and stable Respiratory status: spontaneous breathing, nonlabored ventilation and respiratory function stable Cardiovascular status: blood pressure returned to baseline and stable Postop Assessment: Adequate PO intake and No signs of nausea or vomiting Anesthetic complications: no    Cherly Beach

## 2019-11-17 NOTE — H&P (Signed)
H&P has been reviewed and patient reevaluated, no changes necessary. To be downloaded later.  

## 2019-11-17 NOTE — Discharge Instructions (Signed)
[facility] ENDOSCOPIC SINUS SURGERY [facility]  What is Functional Endoscopic Sinus Surgery?  The Surgery involves making the natural openings of the sinuses larger by removing the bony partitions that separate the sinuses from the nasal cavity.  The natural sinus lining is preserved as much as possible to allow the sinuses to resume normal function after the surgery.  In some patients nasal polyps (excessively swollen lining of the sinuses) may be removed to relieve obstruction of the sinus openings.  The surgery is performed through the nose using lighted scopes, which eliminates the need for incisions on the face.  A septoplasty is a different procedure which is sometimes performed with sinus surgery.  It involves straightening the boy partition that separates the two sides of your nose.  A crooked or deviated septum may need repair if is obstructing the sinuses or nasal airflow.  Turbinate reduction is also often performed during sinus surgery.  The turbinates are bony proturberances from the side walls of the nose which swell and can obstruct the nose in patients with sinus and allergy problems.  Their size can be surgically reduced to help relieve nasal obstruction.  What Can Sinus Surgery Do For Me?  Sinus surgery can reduce the frequency of sinus infections requiring antibiotic treatment.  This can provide improvement in nasal congestion, post-nasal drainage, facial pressure and nasal obstruction.  Surgery will NOT prevent you from ever having an infection again, so it usually only for patients who get infections 4 or more times yearly requiring antibiotics, or for infections that do not clear with antibiotics.  It will not cure nasal allergies, so patients with allergies may still require medication to treat their allergies after surgery. Surgery may improve headaches related to sinusitis, however, some people will continue to  require medication to control sinus headaches related to allergies.  Surgery will do nothing for other forms of headache (migraine, tension or cluster).  What Are the Risks of Endoscopic Sinus Surgery?  Current techniques allow surgery to be performed safely with little risk, however, there are rare complications that patients should be aware of.  Because the sinuses are located around the eyes, there is risk of eye injury, including blindness, though again, this would be quite rare. This is usually a result of bleeding behind the eye during surgery, which puts the vision oat risk, though there are treatments to protect the vision and prevent permanent disrupted by surgery causing a leak of the spinal fluid that surrounds the brain.  More serious complications would include bleeding inside the brain cavity or damage to the brain.  Again, all of these complications are uncommon, and spinal fluid leaks can be safely managed surgically if they occur.  The most common complication of sinus surgery is bleeding from the nose, which may require packing or cauterization of the nose.  Continued sinus have polyps may experience recurrence of the polyps requiring revision surgery.  Alterations of sense of smell or injury to the tear ducts are also rare complications.   What is the Surgery Like, and what is the Recovery?  The Surgery usually takes a couple of hours to perform, and is usually performed under a general anesthetic (completely asleep).  Patients are usually discharged home after a couple of hours.  Sometimes during surgery it is necessary to pack the nose to control bleeding, and the packing is left in place for 24 - 48 hours, and removed by your surgeon.    If a septoplasty was performed during the procedure, there is often a splint placed which must be removed after 5-7 days.   Discomfort: Pain is usually mild to moderate, and can be controlled by prescription pain medication or acetaminophen (Tylenol).   Aspirin, Ibuprofen (Advil, Motrin), or Naprosyn (Aleve) should be avoided, as they can cause increased bleeding.  Most patients feel sinus pressure like they have a bad head cold for several days.  Sleeping with your head elevated can help reduce swelling and facial pressure, as can ice packs over the face.  A humidifier may be helpful to keep the mucous and blood from drying in the nose.   Diet: There are no specific diet restrictions, however, you should generally start with clear liquids and a light diet of bland foods because the anesthetic can cause some nausea.  Advance your diet depending on how your stomach feels.  Taking your pain medication with food will often help reduce stomach upset which pain medications can cause.  Nasal Saline Irrigation: It is important to remove blood clots and dried mucous from the nose as it is healing.  This is done by having you irrigate the nose at least 3 - 4 times daily with a salt water solution.  We recommend using NeilMed Sinus Rinse (available at the drug store).  Fill the squeeze bottle with the solution, bend over a sink, and insert the tip of the squeeze bottle into the nose  of an inch.  Point the tip of the squeeze bottle towards the inside corner of the eye on the same side your irrigating.  Squeeze the bottle and gently irrigate the nose.  If you bend forward as you do this, most of the fluid will flow back out of the nose, instead of down your throat.   The solution should be warm, near body temperature, when you irrigate.   Each time you irrigate, you should use a full squeeze bottle.   Note that if you are instructed to use Nasal Steroid Sprays at any time after your surgery, irrigate with saline BEFORE using the steroid spray, so you do not wash it all out of the nose. Another product, Nasal Saline Gel (such as AYR Nasal Saline Gel) can be applied in each nostril 3 - 4 times daily to moisture the nose and reduce scabbing or crusting.  Bleeding:   Bloody drainage from the nose can be expected for several days, and patients are instructed to irrigate their nose frequently with salt water to help remove mucous and blood clots.  The drainage may be dark red or brown, though some fresh blood may be seen intermittently, especially after irrigation.  Do not blow you nose, as bleeding may occur. If you must sneeze, keep your mouth open to allow air to escape through your mouth.  If heavy bleeding occurs: Irrigate the nose with saline to rinse out clots, then spray the nose 3 - 4 times with Afrin Nasal Decongestant Spray.  The spray will constrict the blood vessels to slow bleeding.  Pinch the lower half of your nose shut to apply pressure, and lay down with your head elevated.  Ice packs over the nose may help as well. If bleeding persists despite these measures, you should notify your doctor.  Do not use the Afrin routinely to control nasal congestion after surgery, as it can result in worsening congestion and may affect healing.   Activity: Return to work varies among patients. Most patients will be out of   work at least 5 - 7 days to recover.  Patient may return to work after they are off of narcotic pain medication, and feeling well enough to perform the functions of their job.  Patients must avoid heavy lifting (over 10 pounds) or strenuous physical for 2 weeks after surgery, so your employer may need to assign you to light duty, or keep you out of work longer if light duty is not possible.  NOTE: you should not drive, operate dangerous machinery, do any mentally demanding tasks or make any important legal or financial decisions while on narcotic pain medication and recovering from the general anesthetic.    Call Your Doctor Immediately if You Have Any of the Following: 1. Bleeding that you cannot control with the above measures 2. Loss of vision, double vision, bulging of the eye or black eyes. 3. Fever over 101 degrees 4. Neck stiffness with severe  headache, fever, nausea and change in mental state. You are always encourage to call anytime with concerns, however, please call with requests for pain medication refills during office hours.  Office Endoscopy: During follow-up visits your doctor will remove any packing or splints that may have been placed and evaluate and clean your sinuses endoscopically.  Topical anesthetic will be used to make this as comfortable as possible, though you may want to take your pain medication prior to the visit.  How often this will need to be done varies from patient to patient.  After complete recovery from the surgery, you may need follow-up endoscopy from time to time, particularly if there is concern of recurrent infection or nasal polyps.  General Anesthesia, Adult, Care After This sheet gives you information about how to care for yourself after your procedure. Your health care provider may also give you more specific instructions. If you have problems or questions, contact your health care provider. What can I expect after the procedure? After the procedure, the following side effects are common:  Pain or discomfort at the IV site.  Nausea.  Vomiting.  Sore throat.  Trouble concentrating.  Feeling cold or chills.  Weak or tired.  Sleepiness and fatigue.  Soreness and body aches. These side effects can affect parts of the body that were not involved in surgery. Follow these instructions at home:  For at least 24 hours after the procedure:  Have a responsible adult stay with you. It is important to have someone help care for you until you are awake and alert.  Rest as needed.  Do not: ? Participate in activities in which you could fall or become injured. ? Drive. ? Use heavy machinery. ? Drink alcohol. ? Take sleeping pills or medicines that cause drowsiness. ? Make important decisions or sign legal documents. ? Take care of children on your own. Eating and drinking  Follow any  instructions from your health care provider about eating or drinking restrictions.  When you feel hungry, start by eating small amounts of foods that are soft and easy to digest (bland), such as toast. Gradually return to your regular diet.  Drink enough fluid to keep your urine pale yellow.  If you vomit, rehydrate by drinking water, juice, or clear broth. General instructions  If you have sleep apnea, surgery and certain medicines can increase your risk for breathing problems. Follow instructions from your health care provider about wearing your sleep device: ? Anytime you are sleeping, including during daytime naps. ? While taking prescription pain medicines, sleeping medicines, or medicines that make you   drowsy.  Return to your normal activities as told by your health care provider. Ask your health care provider what activities are safe for you.  Take over-the-counter and prescription medicines only as told by your health care provider.  If you smoke, do not smoke without supervision.  Keep all follow-up visits as told by your health care provider. This is important. Contact a health care provider if:  You have nausea or vomiting that does not get better with medicine.  You cannot eat or drink without vomiting.  You have pain that does not get better with medicine.  You are unable to pass urine.  You develop a skin rash.  You have a fever.  You have redness around your IV site that gets worse. Get help right away if:  You have difficulty breathing.  You have chest pain.  You have blood in your urine or stool, or you vomit blood. Summary  After the procedure, it is common to have a sore throat or nausea. It is also common to feel tired.  Have a responsible adult stay with you for the first 24 hours after general anesthesia. It is important to have someone help care for you until you are awake and alert.  When you feel hungry, start by eating small amounts of foods  that are soft and easy to digest (bland), such as toast. Gradually return to your regular diet.  Drink enough fluid to keep your urine pale yellow.  Return to your normal activities as told by your health care provider. Ask your health care provider what activities are safe for you. This information is not intended to replace advice given to you by your health care provider. Make sure you discuss any questions you have with your health care provider. Document Revised: 07/10/2017 Document Reviewed: 02/20/2017 Elsevier Patient Education  2020 [employer].  Scopolamine skin patches Remove in 72 hrs. Wash hands immediately after removal. What is this medicine? SCOPOLAMINE (skoe POL a meen) is used to prevent nausea and vomiting caused by motion sickness, anesthesia and surgery. This medicine may be used for other purposes; ask your health care provider or pharmacist if you have questions. COMMON BRAND NAME(S): Transderm Scop What should I tell my health care provider before I take this medicine? They need to know if you have any of these conditions:  are scheduled to have a gastric secretion test  glaucoma  heart disease  kidney disease  liver disease  lung or breathing disease, like asthma  mental illness  prostate disease  seizures  stomach or intestine problems  trouble passing urine  an unusual or allergic reaction to scopolamine, atropine, other medicines, foods, dyes, or preservatives  pregnant or trying to get pregnant  breast-feeding How should I use this medicine? This medicine is for external use only. Follow the directions on the prescription label. Wear only 1 patch at a time. Choose an area behind the ear, that is clean, dry, hairless and free from any cuts or irritation. Wipe the area with a clean dry tissue. Peel off the plastic backing of the skin patch, trying not to touch the adhesive side with your hands. Do not cut the patches. Firmly apply to the area  you have chosen, with the metallic side of the patch to the skin and the tan-colored side showing. Once firmly in place, wash your hands well with soap and water. Do not get this medicine into your eyes. After removing the patch, wash your hands and the   area behind your ear thoroughly with soap and water. The patch will still contain some medicine after use. To avoid accidental contact or ingestion by children or pets, fold the used patch in half with the sticky side together and throw away in the trash out of the reach of children and pets. If you need to use a second patch after you remove the first, place it behind the other ear. A special MedGuide will be given to you by the pharmacist with each prescription and refill. Be sure to read this information carefully each time. Talk to your pediatrician regarding the use of this medicine in children. Special care may be needed. Overdosage: If you think you have taken too much of this medicine contact a poison control center or emergency room at once. NOTE: This medicine is only for you. Do not share this medicine with others. What if I miss a dose? This does not apply. This medicine is not for regular use. What may interact with this medicine?  alcohol  antihistamines for allergy cough and cold  atropine  certain medicines for anxiety or sleep  certain medicines for bladder problems like oxybutynin, tolterodine  certain medicines for depression like amitriptyline, fluoxetine, sertraline  certain medicines for stomach problems like dicyclomine, hyoscyamine  certain medicines for Parkinson's disease like benztropine, trihexyphenidyl  certain medicines for seizures like phenobarbital, primidone  general anesthetics like halothane, isoflurane, methoxyflurane, propofol  ipratropium  local anesthetics like lidocaine, pramoxine, tetracaine  medicines that relax muscles for surgery  phenothiazines like chlorpromazine, mesoridazine,  prochlorperazine, thioridazine  narcotic medicines for pain  other belladonna alkaloids This list may not describe all possible interactions. Give your health care provider a list of all the medicines, herbs, non-prescription drugs, or dietary supplements you use. Also tell them if you smoke, drink alcohol, or use illegal drugs. Some items may interact with your medicine. What should I watch for while using this medicine? Limit contact with water while swimming and bathing because the patch may fall off. If the patch falls off, throw it away and put a new one behind the other ear. You may get drowsy or dizzy. Do not drive, use machinery, or do anything that needs mental alertness until you know how this medicine affects you. Do not stand or sit up quickly, especially if you are an older patient. This reduces the risk of dizzy or fainting spells. Alcohol may interfere with the effect of this medicine. Avoid alcoholic drinks. Your mouth may get dry. Chewing sugarless gum or sucking hard candy, and drinking plenty of water may help. Contact your healthcare professional if the problem does not go away or is severe. This medicine may cause dry eyes and blurred vision. If you wear contact lenses, you may feel some discomfort. Lubricating drops may help. See your healthcare professional if the problem does not go away or is severe. If you are going to need surgery, an MRI, CT scan, or other procedure, tell your healthcare professional that you are using this medicine. You may need to remove the patch before the procedure. What side effects may I notice from receiving this medicine? Side effects that you should report to your doctor or health care professional as soon as possible:  allergic reactions like skin rash, itching or hives; swelling of the face, lips, or tongue  blurred vision  changes in vision  confusion  dizziness  eye pain  fast, irregular heartbeat  hallucinations, loss of contact  with reality  nausea, vomiting    pain or trouble passing urine  restlessness  seizures  skin irritation  stomach pain Side effects that usually do not require medical attention (report to your doctor or health care professional if they continue or are bothersome):  drowsiness  dry mouth  headache  sore throat This list may not describe all possible side effects. Call your doctor for medical advice about side effects. You may report side effects to FDA at 1-800-FDA-1088. Where should I keep my medicine? Keep out of the reach of children. Store at room temperature between 20 and 25 degrees C (68 and 77 degrees F). Keep this medicine in the foil package until ready to use. Throw away any unused medicine after the expiration date. NOTE: This sheet is a summary. It may not cover all possible information. If you have questions about this medicine, talk to your doctor, pharmacist, or health care provider.  2020 Elsevier/Gold Standard (2017-09-25 16:14:46)   

## 2019-11-18 ENCOUNTER — Encounter: Payer: Self-pay | Admitting: *Deleted

## 2019-11-18 LAB — SURGICAL PATHOLOGY

## 2020-01-03 ENCOUNTER — Ambulatory Visit
Admission: RE | Admit: 2020-01-03 | Discharge: 2020-01-03 | Disposition: A | Discharge status: Home / Self Care | Payer: 59 | Source: Ambulatory Visit | Attending: Internal Medicine | Admitting: Internal Medicine

## 2020-01-03 ENCOUNTER — Other Ambulatory Visit: Payer: Self-pay

## 2020-01-03 DIAGNOSIS — Z1231 Encounter for screening mammogram for malignant neoplasm of breast: Secondary | ICD-10-CM

## 2020-03-20 ENCOUNTER — Ambulatory Visit: Payer: 59 | Attending: Internal Medicine

## 2020-03-20 DIAGNOSIS — Z23 Encounter for immunization: Secondary | ICD-10-CM

## 2020-03-20 NOTE — Progress Notes (Signed)
   Cov[facility] Vaccination [facility]  Name:  Amanda English    MRN: [id number] DOB: [birth year]  03/20/2020  [name] was observed post Covid-19 immunization for 15 minutes without incident. She was provided with Vaccine Information Sheet and instruction to access the V-Safe system.   [name] was instructed to call 911 with any severe reactions post vaccine: Marland Kitchen Difficulty breathing  . Swelling of face and throat  . A fast heartbeat  . A bad rash all over body  . Dizziness and weakness

## 2020-04-17 ENCOUNTER — Ambulatory Visit
Admission: RE | Admit: 2020-04-17 | Discharge: 2020-04-17 | Disposition: A | Discharge status: Home / Self Care | Payer: 59 | Source: Ambulatory Visit | Attending: Internal Medicine | Admitting: Internal Medicine

## 2020-04-17 ENCOUNTER — Other Ambulatory Visit: Payer: Self-pay | Admitting: Internal Medicine

## 2020-04-17 ENCOUNTER — Other Ambulatory Visit: Payer: Self-pay

## 2020-04-17 DIAGNOSIS — R112 Nausea with vomiting, unspecified: Secondary | ICD-10-CM

## 2020-04-17 LAB — POCT I-STAT CREATININE: Creatinine, Ser: 0.8 mg/dL (ref 0.44–1.00)

## 2020-04-17 MED ORDER — IOHEXOL 300 MG/ML  SOLN
125.0000 mL | Freq: Once | INTRAMUSCULAR | Status: AC | PRN
  Administered 2020-04-17: 125 mL via INTRAVENOUS

## 2020-06-04 ENCOUNTER — Other Ambulatory Visit: Payer: Self-pay

## 2020-06-08 ENCOUNTER — Other Ambulatory Visit: Payer: Self-pay

## 2020-06-08 ENCOUNTER — Encounter: Payer: Self-pay | Admitting: Gastroenterology

## 2020-06-08 ENCOUNTER — Ambulatory Visit: Payer: 59 | Admitting: Gastroenterology

## 2020-06-08 VITALS — BP 122/77 | HR 80 | Temp 97.6°F | Ht 63.0 in | Wt 266.0 lb

## 2020-06-08 DIAGNOSIS — R7989 Other specified abnormal findings of blood chemistry: Secondary | ICD-10-CM

## 2020-06-08 DIAGNOSIS — E1169 Type 2 diabetes mellitus with other specified complication: Secondary | ICD-10-CM | POA: Diagnosis not present

## 2020-06-08 DIAGNOSIS — R1013 Epigastric pain: Secondary | ICD-10-CM

## 2020-06-08 DIAGNOSIS — K760 Fatty (change of) liver, not elsewhere classified: Secondary | ICD-10-CM

## 2020-06-08 NOTE — Progress Notes (Signed)
Arlyss Repress, MD [address]  [address]  [place], [place] [address]  Main: [phone]  Fax: [phone]    Gastroenterology Consultation  Referring Provider:     Enid Baas, MD Primary Care Physician:  Enid Baas, MD Primary Gastroenterologist:  Dr. Arlyss Repress Reason for Consultation:    Nausea, vomiting, epigastric pain, fatty liver       HPI:   Amanda English is a 44 y.o. female referred by Dr. Enid Baas, MD  for consultation & management of rectal bleeding and external hemorrhoid.  She saw Dr. Marin Olp about 2 months ago secondary to severe rectal pain and bleeding and was found to have thrombosed external hemorrhoid.  The pain almost alleviated by the time she saw Dr. Cliffton Asters.  Therefore, this was not lanced.  She was prescribed nifedipine ointment and was referred to me for hemorrhoid ligation.  Patient has used nifedipine ointment for about a month.  Patient reports that she has been dealing with hemorrhoidal symptoms including rectal pressure, discomfort, itching, intermittent rectal bleeding for more than 6 months at least.  She has tried over-the-counter hemorrhoidal creams which provided minimal relief.  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  She said she had a stressful 2 years as a Child psychotherapist in the community.  For last 1 year, she reports having episodes of nausea, vomiting, epigastric pain, early satiety which last for 2 to 3 weeks.  She manages going on a bland diet and slowly reintroduces the foods.  She is also on Protonix 40 mg daily which does partially help with symptoms.  Currently, she does not have any upper GI symptoms.  She has  history of diabetes, metabolic syndrome, has not made changes in her lifestyle we discussed during last visit.  She continues to drink soft drinks, consume fatty foods, red meat regularly. She reports that her weight has been trending up.  Most recent labs revealed increase in LFTs from 03/2020.  She does not know her hemoglobin A1c levels.  She does not smoke or drink alcohol  NSAIDs: None  Antiplts/Anticoagulants/Anti thrombotics: None  GI Procedures: Reports having had a colonoscopy more than 12 years ago for abdominal pain  Colonoscopy 10/04/2018 - The entire examined colon is normal. - Non-bleeding external hemorrhoids. - No specimens collected.  Past Medical History:  Diagnosis Date  . Anxiety   . Arthritis   . Chiari malformation   . Complication of anesthesia    woke during one surgery  . Depression   . Diabetes mellitus without complication (HCC)   . Diabetes mellitus, type II (HCC)   . Family history of adverse reaction to anesthesia    Father - woke during one surgery  . Fatty liver   . GERD (gastroesophageal reflux disease)   . High cholesterol   . Hypertension   . Migraine headache    approx 2x/month  . Mitral valve regurgitation   . Spina bifida occulta     Past Surgical History:  Procedure Laterality Date  . ABDOMINAL HYSTERECTOMY    . APPENDECTOMY    . CESAREAN SECTION    . COLONOSCOPY WITH PROPOFOL N/A 10/04/2018   Procedure: COLONOSCOPY WITH PROPOFOL;  Surgeon: Toney Reil,  MD;  Location: [facility] END[facility]COPY;  Service: Gastroenterology;  Laterality: N/A;  . ETHMOIDECTOMY Bilateral 11/17/2019   Procedure: TOTAL ETHMOIDECTOMY WITH FRONTAL SINUOTOMY;  Surgeon: Vernie Murders, MD;  Location: [facility];  Service: ENT;  Laterality: Bilateral;  . IMAGE GUIDED SINUS SURGERY Bilateral 11/17/2019   Procedure: IMAGE GUIDED SINUS SURGERY;  Surgeon: Vernie Murders, MD;  Location: [facility];  Service: ENT;  Laterality: Bilateral;  need stryker  disk GAVE DISK TO BRENDA 4-23  KP  . MAXILLARY ANTROSTOMY Bilateral 11/17/2019   Procedure: MAXILLARY ANTROSTOMY;  Surgeon: Vernie Murders, MD;  Location: [facility];  Service: ENT;  Laterality: Bilateral;  . NOSE SURGERY    . STERNAL WIRE REMOVAL    . thromectomy    . TURBINATE REDUCTION Bilateral 11/17/2019   Procedure: TURBINATE REDUCTION;  Surgeon: Vernie Murders, MD;  Location: [facility];  Service: ENT;  Laterality: Bilateral;  Diabetic - oral meds    Current Outpatient Medications:  .  ALPRAZolam (XANAX) 0.5 MG tablet, Take 0.5 mg by mouth every 6 (six) hours as needed., Disp: , Rfl:  .  atorvastatin (LIPITOR) 40 MG tablet, atorvastatin 40 mg tablet, Disp: , Rfl:  .  furosemide (LASIX) 20 MG tablet, Take 20 mg by mouth daily as needed., Disp: , Rfl:  .  ketorolac (TORADOL) 10 MG tablet, as needed. , Disp: , Rfl: 0 .  Lancets (ONETOUCH DELICA PLUS LANCET33G) MISC, USE TO CHECK SUGARS ONCE DAILY DX E11.9, Disp: , Rfl:  .  loratadine (CLARITIN) 10 MG tablet, Take by mouth., Disp: , Rfl:  .  metFORMIN (GLUCOPHAGE) 1000 MG tablet, Take 500 mg by mouth 2 (two) times daily with a meal. 500 mg AM, 1000 mg PM, Disp: , Rfl:  .  metoprolol tartrate (LOPRESSOR) 25 MG tablet, Take 12.5 mg by mouth 2 (two) times daily., Disp: , Rfl:  .  ondansetron (ZOFRAN) 4 MG tablet, Take 4 mg by mouth every 8 (eight) hours as needed for nausea or vomiting., Disp: , Rfl:  .  ONETOUCH VERIO test strip, USE TO CHECK SUGARS DAILY, Disp: , Rfl:  .  pantoprazole (PROTONIX) 40 MG tablet, Take 40 mg by mouth daily., Disp: , Rfl:  .  spironolactone (ALDACTONE) 25 MG tablet, Take 25 mg by mouth daily., Disp: , Rfl:  .  traZODone (DESYREL) 100 MG tablet, trazodone 100 mg tablet, Disp: , Rfl:  .  Triamcinolone Acetonide (NASACORT AQ NA), Place into the nose daily as needed., Disp: , Rfl:   Current Facility-Administered Medications:  .  triamcinolone acetonide (KENALOG) 10 MG/ML injection 10 mg, 10 mg,  Other, Once, Vivi Barrack, DPM .  triamcinolone acetonide (KENALOG) 10 MG/ML injection 10 mg, 10 mg, Other, Once, Vivi Barrack, DPM   Family History  Problem Relation Age of Onset  . Depression Mother   . Bipolar disorder Mother   . Alzheimer's disease Father   . Thyroid disease Brother      Social History   Tobacco Use  . Smoking status: Former Smoker    Packs/day: 0.50    Types: Cigarettes    Quit date: 07/21/2017    Years since quitting: 2.8  . Smokeless tobacco: Never Used  Vaping Use  . Vaping Use: Every day  . Start date: 07/21/2017  . Substances: Nicotine, Flavoring, Nicotine-salt  . Devices: SMOK  Substance Use Topics  . Alcohol use: No  . Drug use: No    Allergies as of 06/08/2020 - Review Complete 06/08/2020  Allergen  Reaction Noted  . Ibuprofen  08/20/2018  . Nsaids Swelling 03/23/2014    Review of Systems:    All systems reviewed and negative except where noted in HPI.   Physical Exam:  BP 122/77 (BP Location: Left Arm, Patient Position: Sitting, Cuff Size: Normal)   Pulse 80   Temp 97.6 F (36.4 C) (Oral)   Ht 5\' 3"  (1.6 m)   Wt 266 lb (120.7 kg)   BMI 47.12 kg/m  No LMP recorded. Patient has had a hysterectomy.  General:   Alert,  Well-developed, well-nourished, pleasant and cooperative in NAD Head:  Normocephalic and atraumatic. Eyes:  Sclera clear, no icterus.   Conjunctiva pink. Ears:  Normal auditory acuity. Nose:  No deformity, discharge, or lesions. Mouth:  No deformity or lesions,oropharynx pink & moist. Neck:  Supple; no masses or thyromegaly. Lungs:  Respirations even and unlabored.  Clear throughout to auscultation.   No wheezes, crackles, or rhonchi. No acute distress. Heart:  Regular rate and rhythm; no murmurs, clicks, rubs, or gallops. Abdomen:  Normal bowel sounds. Soft, non-tender and non-distended without masses, hepatosplenomegaly or hernias noted.  No guarding or rebound tenderness.   Rectal: Not performed Msk:   Symmetrical without gross deformities. Good, equal movement & strength bilaterally. Pulses:  Normal pulses noted. Extremities:  No clubbing or edema.  No cyanosis. Neurologic:  Alert and oriented x3;  grossly normal neurologically. Skin:  Intact without significant lesions or rashes. No jaundice. Psych:  Alert and cooperative. Normal mood and affect.  Imaging Studies: Reviewed  Assessment and Plan:   Maud Rubendall is a 44 y.o. 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

## 2020-06-09 LAB — HEMOGLOBIN A1C
Est. average glucose Bld gHb Est-mCnc: 151 mg/dL
Hgb A1c MFr Bld: 6.9 % — ABNORMAL HIGH (ref 4.8–5.6)

## 2020-06-12 ENCOUNTER — Encounter: Payer: Self-pay | Admitting: Gastroenterology

## 2020-08-08 ENCOUNTER — Other Ambulatory Visit: Payer: 59

## 2020-08-08 DIAGNOSIS — Z20822 Contact with and (suspected) exposure to covid-19: Secondary | ICD-10-CM

## 2020-08-09 LAB — NOVEL CORONAVIRUS, NAA: SARS-CoV-2, NAA: NOT DETECTED

## 2020-08-09 LAB — SARS-COV-2, NAA 2 DAY TAT

## 2020-08-27 ENCOUNTER — Encounter (HOSPITAL_COMMUNITY): Payer: Self-pay

## 2020-08-27 ENCOUNTER — Other Ambulatory Visit: Payer: Self-pay

## 2020-08-27 ENCOUNTER — Emergency Department (HOSPITAL_COMMUNITY)
Admission: EM | Admit: 2020-08-27 | Discharge: 2020-08-27 | Disposition: A | Discharge status: Home / Self Care | Payer: 59 | Attending: Emergency Medicine | Admitting: Emergency Medicine

## 2020-08-27 DIAGNOSIS — R29898 Other symptoms and signs involving the musculoskeletal system: Secondary | ICD-10-CM

## 2020-08-27 DIAGNOSIS — Z7984 Long term (current) use of oral hypoglycemic drugs: Secondary | ICD-10-CM | POA: Insufficient documentation

## 2020-08-27 DIAGNOSIS — E119 Type 2 diabetes mellitus without complications: Secondary | ICD-10-CM | POA: Insufficient documentation

## 2020-08-27 DIAGNOSIS — R208 Other disturbances of skin sensation: Secondary | ICD-10-CM | POA: Diagnosis not present

## 2020-08-27 DIAGNOSIS — I10 Essential (primary) hypertension: Secondary | ICD-10-CM | POA: Insufficient documentation

## 2020-08-27 DIAGNOSIS — Z79899 Other long term (current) drug therapy: Secondary | ICD-10-CM | POA: Insufficient documentation

## 2020-08-27 DIAGNOSIS — Z87891 Personal history of nicotine dependence: Secondary | ICD-10-CM | POA: Diagnosis not present

## 2020-08-27 DIAGNOSIS — R531 Weakness: Secondary | ICD-10-CM | POA: Diagnosis not present

## 2020-08-27 LAB — URINALYSIS, ROUTINE W REFLEX MICROSCOPIC
Bilirubin Urine: NEGATIVE
Glucose, UA: NEGATIVE mg/dL
Hgb urine dipstick: NEGATIVE
Ketones, ur: NEGATIVE mg/dL
Leukocytes,Ua: NEGATIVE
Nitrite: NEGATIVE
Protein, ur: NEGATIVE mg/dL
Specific Gravity, Urine: 1.01 (ref 1.005–1.030)
pH: 6 (ref 5.0–8.0)

## 2020-08-27 LAB — COMPREHENSIVE METABOLIC PANEL
ALT: 33 U/L (ref 0–44)
AST: 27 U/L (ref 15–41)
Albumin: 4.3 g/dL (ref 3.5–5.0)
Alkaline Phosphatase: 84 U/L (ref 38–126)
Anion gap: 12 (ref 5–15)
BUN: 9 mg/dL (ref 6–20)
CO2: 25 mmol/L (ref 22–32)
Calcium: 9.3 mg/dL (ref 8.9–10.3)
Chloride: 99 mmol/L (ref 98–111)
Creatinine, Ser: 0.72 mg/dL (ref 0.44–1.00)
GFR, Estimated: >60 mL/min (ref >60)
Glucose, Bld: 89 mg/dL (ref 70–99)
Potassium: 3.9 mmol/L (ref 3.5–5.1)
Sodium: 136 mmol/L (ref 135–145)
Total Bilirubin: 0.9 mg/dL (ref 0.3–1.2)
Total Protein: 7.1 g/dL (ref 6.5–8.1)

## 2020-08-27 LAB — CBC WITH DIFFERENTIAL/PLATELET
Abs Immature Granulocytes: 0.07 10*3/uL (ref 0.00–0.07)
Basophils Absolute: 0 10*3/uL (ref 0.0–0.1)
Basophils Relative: 0 %
Eosinophils Absolute: 0.6 10*3/uL — ABNORMAL HIGH (ref 0.0–0.5)
Eosinophils Relative: 6 %
HCT: 36.6 % (ref 36.0–46.0)
Hemoglobin: 11.6 g/dL — ABNORMAL LOW (ref 12.0–15.0)
Immature Granulocytes: 1 %
Lymphocytes Relative: 15 %
Lymphs Abs: 1.7 10*3/uL (ref 0.7–4.0)
MCH: 28.8 pg (ref 26.0–34.0)
MCHC: 31.7 g/dL (ref 30.0–36.0)
MCV: 90.8 fL (ref 80.0–100.0)
Monocytes Absolute: 0.6 10*3/uL (ref 0.1–1.0)
Monocytes Relative: 6 %
Neutro Abs: 8 10*3/uL — ABNORMAL HIGH (ref 1.7–7.7)
Neutrophils Relative %: 72 %
Platelets: 317 10*3/uL (ref 150–400)
RBC: 4.03 MIL/uL (ref 3.87–5.11)
RDW: 15 % (ref 11.5–15.5)
WBC: 11 10*3/uL — ABNORMAL HIGH (ref 4.0–10.5)
nRBC: 0 % (ref 0.0–0.2)

## 2020-08-27 LAB — MAGNESIUM: Magnesium: 1.8 mg/dL (ref 1.7–2.4)

## 2020-08-27 LAB — CK: Total CK: 37 U/L — ABNORMAL LOW (ref 38–234)

## 2020-08-27 NOTE — ED Triage Notes (Signed)
Patient reports that she was given Prednisone for a rash that she developed after being diagnosed with a UTI and received Nitrofuratin Patient states that she has had burning in her lower abdomen and bilateral lower extremities. Patient went to an UC and was instructed to come to the ED. Patient denies a rash at this time.

## 2020-08-27 NOTE — ED Provider Notes (Signed)
[facility]-EMERGENCY DEPT Provider Note   CSN: [id number] Arrival date & time: 08/27/20  1841     History Chief Complaint  Patient presents with  . Abdominal Pain    Amanda English is a 45 y.o. female.  45 year old female with history of DM, JRA, inflammatory polyarthropathy with complaint of burning sensation and weakness to proximal upper and lower extremities, burning sensation across lower abdomen. 08/15/20, diagnosed with UTI, treated with nitrofurantoin. Returned to PCP office 08/22/20 for rash on chest and arms, scalp, with itching and fever of 100.4, started on Prednisone, told to take Tylenol, fever thought to be viral (negative for COVID x 2, flu negative). Developed burning to anterior thighs, arms, lower abdomen 08/24/20. Advised to call office if symptoms did not resolve by today, went to walk in clinic and was told to go to the ER.   Burning is more bothersome with movement, then slowly improves and will resolve with rest/not moving. Reports legs and upper arms feel weak/like Jello. Rash has resolved. No difficulty walking, not dropping things. No changes in vision, speech, gait.         Past Medical History:  Diagnosis Date  . Anxiety   . Arthritis   . Chiari malformation   . Complication of anesthesia    woke during one surgery  . Depression   . Diabetes mellitus without complication (HCC)   . Diabetes mellitus, type II (HCC)   . Family history of adverse reaction to anesthesia    Father - woke during one surgery  . Fatty liver   . GERD (gastroesophageal reflux disease)   . High cholesterol   . Hypertension   . Migraine headache    approx 2x/month  . Mitral valve regurgitation   . Spina bifida occulta     Patient Active Problem List   Diagnosis Date Noted  . Osteoarthritis of left knee 03/01/2019  . Rectal bleeding   . Sprain of knee and leg 08/20/2018  . Plantar fasciitis 12/06/2015  . Achilles tendonitis, bilateral 12/06/2015  .  Contusion of bone 08/31/2015  . Loss of memory 07/14/2015  . Chronic tension-type headache, not intractable 06/18/2015  . Hyperlipidemia 04/17/2015  . Migraine headache 03/23/2015  . Tobacco abuse 04/14/2014  . Headache 01/27/2014  . Memory loss 01/27/2014  . Sleep disorder 01/27/2014  . Arthralgia 09/12/2013  . H/O juvenile rheumatoid arthritis 09/12/2013  . 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 Obesity 09/12/2013  . Personal history of arthritis 09/12/2013  . Bilateral hand pain 08/24/2013  . Fibromyalgia 08/24/2013  . Myalgia and myositis 08/24/2013  . Right ankle swelling 08/24/2013  . Rheumatoid arthritis (HCC) 08/24/2013  . Diffuse connective tissue disease (HCC) 08/24/2013  . 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 Arnold-Chiari malformation (HCC) 08/08/2013  . Depression 08/08/2013  . Diabetes mellitus (HCC) 08/08/2013  . Hypertension 08/08/2013    Past Surgical History:  Procedure Laterality Date  . ABDOMINAL HYSTERECTOMY    . APPENDECTOMY    . CESAREAN SECTION    . COLONOSCOPY WITH PROPOFOL N/A 10/04/2018   Procedure: COLONOSCOPY WITH PROPOFOL;  Surgeon: Toney Reil, MD;  Location: [facility];  Service: Gastroenterology;  Laterality: N/A;  . ETHMOIDECTOMY Bilateral 11/17/2019   Procedure: TOTAL ETHMOIDECTOMY WITH FRONTAL SINUOTOMY;  Surgeon: Vernie Murders, MD;  Location: [facility];  Service: ENT;  Laterality: Bilateral;  . IMAGE GUIDED SINUS SURGERY Bilateral 11/17/2019   Procedure: IMAGE GUIDED SINUS SURGERY;  Surgeon: Vernie Murders, MD;  Location: [facility];  Service: ENT;  Laterality: Bilateral;  need stryker disk GAVE DISK TO BRENDA 4-23  KP  . MAXILLARY ANTROSTOMY Bilateral 11/17/2019   Procedure: MAXILLARY ANTROSTOMY;  Surgeon: Vernie Murders, MD;  Location: [facility];  Service: ENT;  Laterality: Bilateral;  . NOSE SURGERY    . STERNAL WIRE REMOVAL    . thromectomy    . thymus removal     . TURBINATE REDUCTION Bilateral 11/17/2019   Procedure: TURBINATE REDUCTION;  Surgeon: Vernie Murders, MD;  Location: [facility];  Service: ENT;  Laterality: Bilateral;  Diabetic - oral meds     OB History   No obstetric history on file.     Family History  Problem Relation Age of Onset  . Depression Mother   . Bipolar disorder Mother   . Alzheimer's disease Father   . Thyroid disease Brother     Social History   Tobacco Use  . Smoking status: Former Smoker    Packs/day: 0.50    Types: Cigarettes    Quit date: 07/21/2017    Years since quitting: 3.1  . Smokeless tobacco: Never Used  Vaping Use  . Vaping Use: Every day  . Start date: 07/21/2017  . Substances: Nicotine, Flavoring, Nicotine-salt  . Devices: SMOK  Substance Use Topics  . Alcohol use: No  . Drug use: No    Home Medications Prior to Admission medications   Medication Sig Start Date End Date Taking? Authorizing Provider  ALPRAZolam Prudy Feeler) 0.5 MG tablet Take 0.5 mg by mouth every 6 (six) hours as needed. 02/23/20   [provider]  atorvastatin (LIPITOR) 40 MG tablet atorvastatin 40 mg tablet    [provider]  furosemide (LASIX) 20 MG tablet Take 20 mg by mouth daily as needed.    [provider]  ketorolac (TORADOL) 10 MG tablet as needed.  06/21/14   [provider]  Lancets (ONETOUCH DELICA PLUS LANCET33G) MISC USE TO CHECK SUGARS ONCE DAILY DX E11.9 06/03/18   [provider]  loratadine (CLARITIN) 10 MG tablet Take by mouth.    [provider]  metFORMIN (GLUCOPHAGE) 1000 MG tablet Take 500 mg by mouth 2 (two) times daily with a meal. 500 mg AM, 1000 mg PM    [provider]  metoprolol tartrate (LOPRESSOR) 25 MG tablet Take 12.5 mg by mouth 2 (two) times daily. 03/18/20   [provider]  ondansetron (ZOFRAN) 4 MG tablet Take 4 mg by mouth every 8 (eight) hours as needed for nausea or vomiting.    [provider]   [facility] VERIO test strip USE TO CHECK SUGARS DAILY 06/04/18   [provider]  pantoprazole (PROTONIX) 40 MG tablet Take 40 mg by mouth daily.    [provider]  spironolactone (ALDACTONE) 25 MG tablet Take 25 mg by mouth daily.    [provider]  traZODone (DESYREL) 100 MG tablet trazodone 100 mg tablet 05/30/16   [provider]  Triamcinolone Acetonide (NASACORT AQ NA) Place into the nose daily as needed.    [provider]    Allergies    Ibuprofen and Nsaids  Review of Systems   Review of Systems  Constitutional: Negative for fever.  Eyes: Negative for visual disturbance.  Gastrointestinal: Negative for nausea and vomiting.  Musculoskeletal: Negative for arthralgias, gait problem, joint swelling and myalgias.  Skin: Negative for rash and wound.  Allergic/Immunologic: Positive for immunocompromised state.  Neurological: Positive for weakness. Negative for  speech difficulty and numbness.  Psychiatric/Behavioral: Negative for confusion.  All other systems reviewed and are negative.   Physical Exam Updated Vital Signs BP (!) 147/76 (BP Location: Right Arm)   Pulse 68   Temp 98.3 F (36.8 C) (Oral)   Resp 16   Ht  (1.6 m)   Wt 113.4 kg   SpO2 97%   BMI 44.29 kg/m   Physical Exam Vitals and nursing note reviewed.  Constitutional:      General: She is not in acute distress.    Appearance: She is well-developed and well-nourished. She is obese. She is not diaphoretic.  HENT:     Head: Normocephalic and atraumatic.     Mouth/Throat:     Mouth: Mucous membranes are moist.     Pharynx: Oropharynx is clear.  Eyes:     Extraocular Movements: Extraocular movements intact.     Pupils: Pupils are equal, round, and reactive to light.  Cardiovascular:     Pulses: Normal pulses.  Pulmonary:     Effort: Pulmonary effort is normal.  Musculoskeletal:        General: Normal range of motion.     Cervical back: Neck supple.      Right lower leg: No edema.     Left lower leg: No edema.  Skin:    General: Skin is warm and dry.     Findings: No erythema or rash.  Neurological:     General: No focal deficit present.     Mental Status: She is alert and oriented to person, place, and time.     Cranial Nerves: No cranial nerve deficit.     Sensory: No sensory deficit.     Motor: No weakness.     Gait: Gait normal.     Comments: Symmetric upper and lower extremity strength. No appreciable weakness.   Psychiatric:        Mood and Affect: Mood and affect normal.        Behavior: Behavior normal.     ED Results / Procedures / Treatments   Labs (all labs ordered are listed, but only abnormal results are displayed) Labs Reviewed  CBC WITH DIFFERENTIAL/PLATELET - Abnormal; Notable for the following components:      Result Value   WBC 11.0 (*)    Hemoglobin 11.6 (*)    Neutro Abs 8.0 (*)    Eosinophils Absolute 0.6 (*)    All other components within normal limits  CK - Abnormal; Notable for the following components:   Total CK 37 (*)    All other components within normal limits  URINALYSIS, ROUTINE W REFLEX MICROSCOPIC  COMPREHENSIVE METABOLIC PANEL  MAGNESIUM    EKG None  Radiology No results found.  Procedures Procedures   Medications Ordered in ED Medications - No data to display  ED Course  I have reviewed the triage vital signs and the nursing notes.  Pertinent labs & imaging results that were available during my care of the patient were reviewed by me and considered in my medical decision making (see chart for details).  Clinical Course as of 08/27/20 2248  Mon Aug 27, 2020  [id number] 45 year old female with complaint of bilateral proximal extremity weakness/burning x 3 days, also burning to lower abdominal area as above. Exam unremarkable.  Labs reassuring including CBC (mild leukocytosis possibly due to recent prednisone), CMP WNL, UA normal, magnesium normal, CK 37. Discussed with Dr.  Criss Alvine, ER attending, plan is for pt to follow up  with PCP.  [LM]    Clinical Course User Index [LM] Alden Hipp   MDM Rules/Calculators/A&P                          Final Clinical Impression(s) / ED Diagnoses Final diagnoses:  Weakness of extremity    Rx / DC Orders ED Discharge Orders    None       Jeannie Fend, PA-C 08/27/20 2248    Pricilla Loveless, MD 08/30/20 1616

## 2020-08-27 NOTE — Discharge Instructions (Addendum)
Your labs today are reassuring including CBC, CMP, Ck, Mg and UA. Recommend follow up with your doctor, return to the ER for worsening or concerning symptoms.

## 2020-09-09 IMAGING — US US RENAL
1 series · 14 of 25 positions shown · non-contrast
Comparison: CT abdomen and pelvis September 17, 2018. Renal
ultrasound March 01, 2018

CLINICAL DATA: Acute pyelonephritis

EXAM:
RENAL / URINARY TRACT ULTRASOUND COMPLETE

[Series 1: us renal · 0.28mm/px · 14 of 29 slices shown]
[im 1/29]
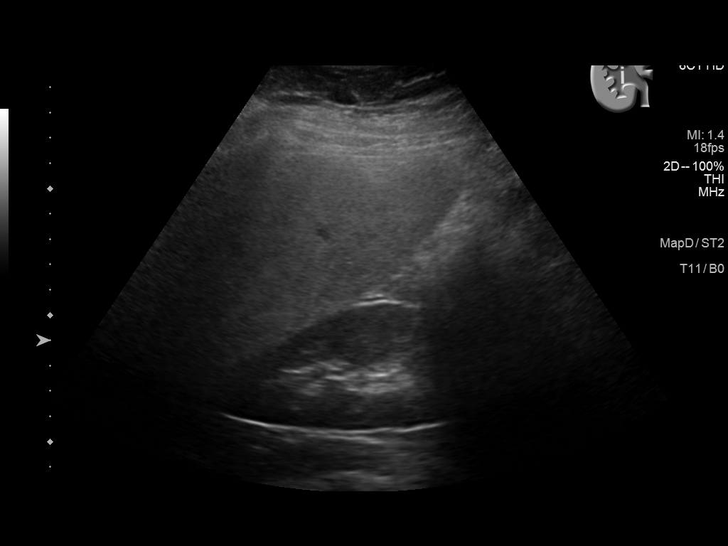
[im 3/29]
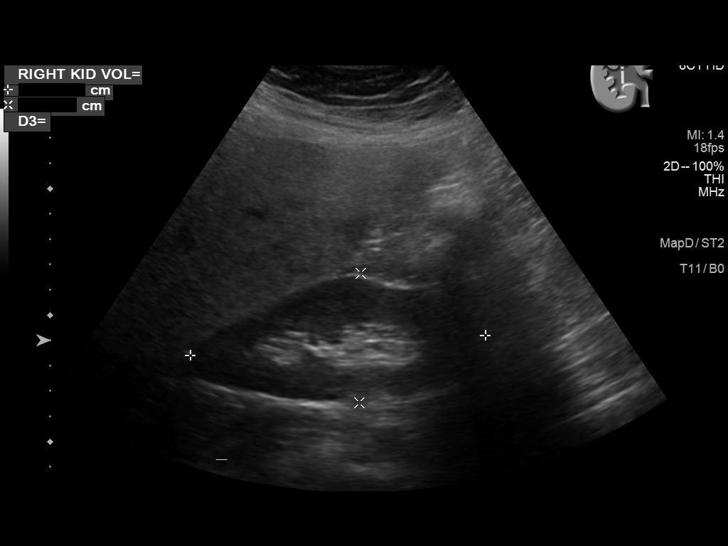
[im 5/29]
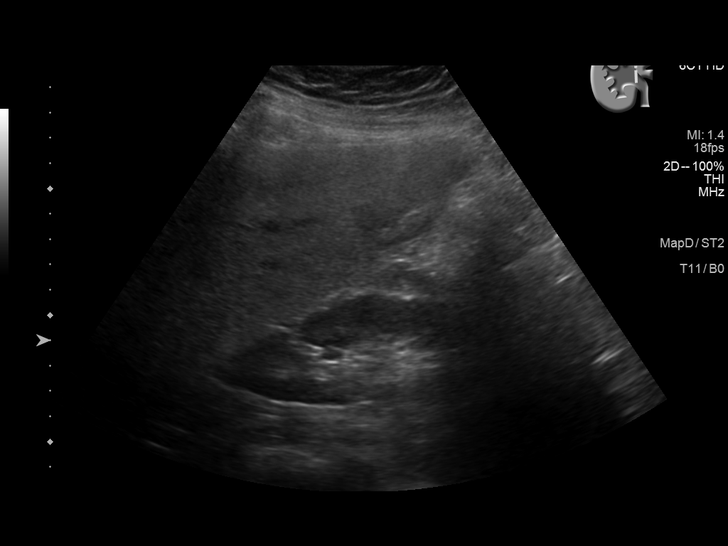
[im 8/29]
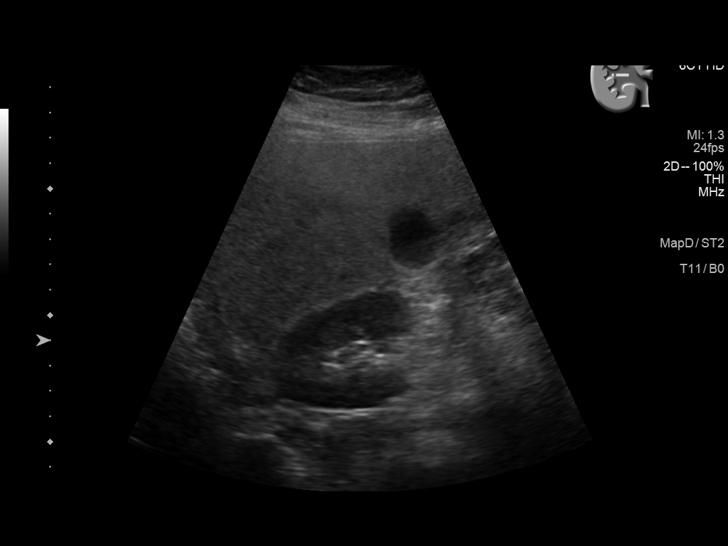
[im 10/29]
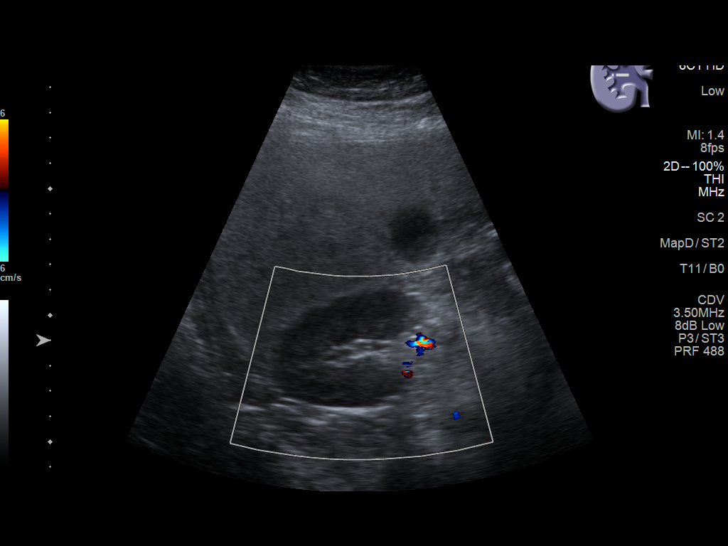
[im 11/29]
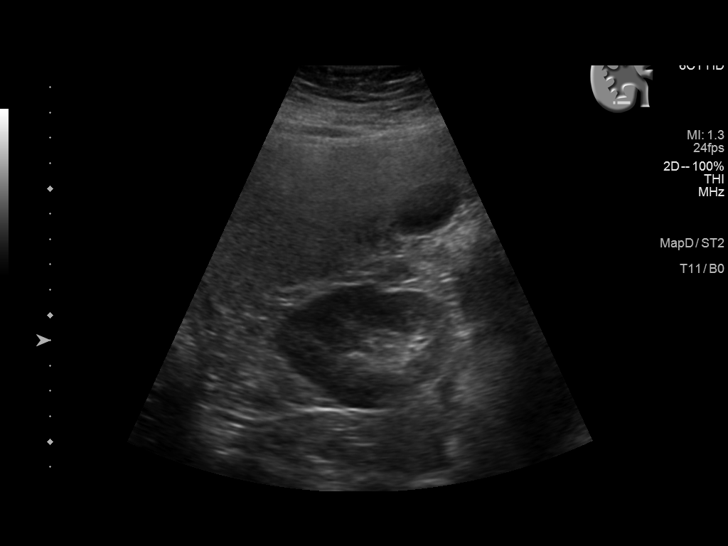
[im 13/29]
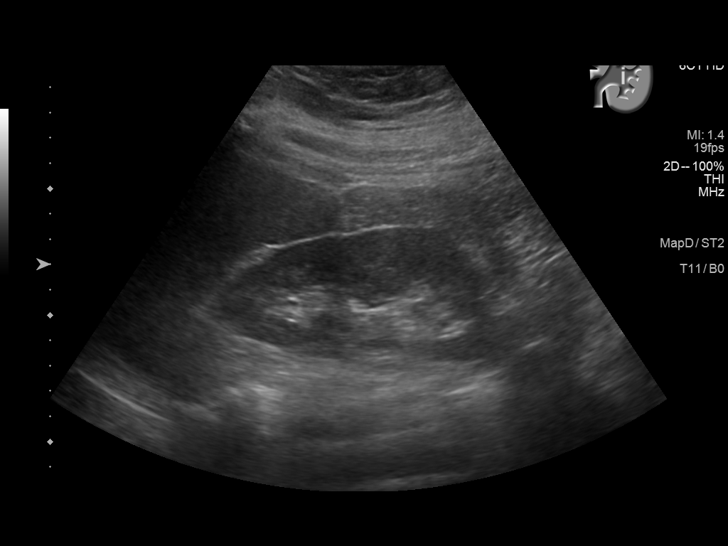
[im 16/29]
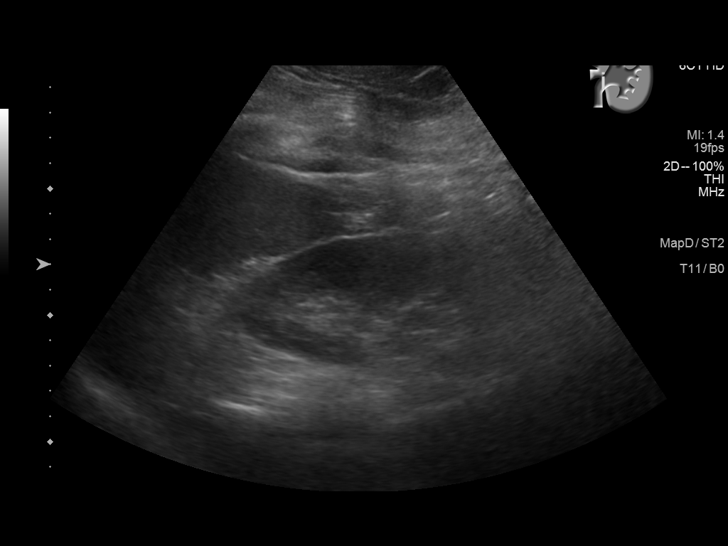
[im 18/29]
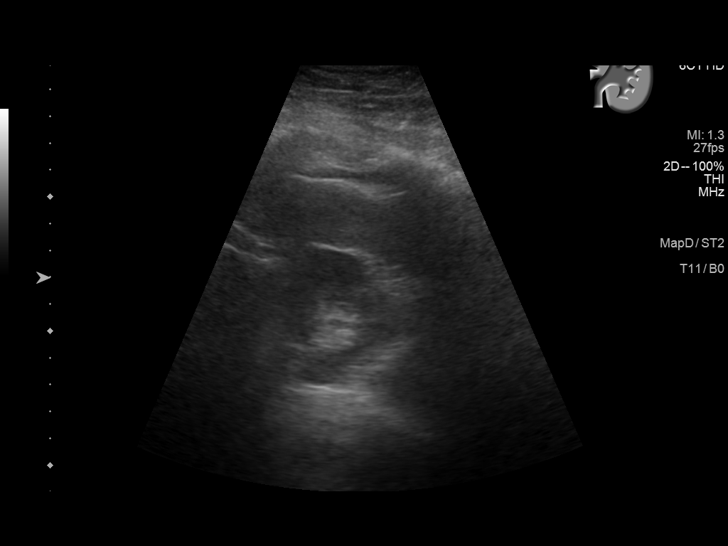
[im 19/29]
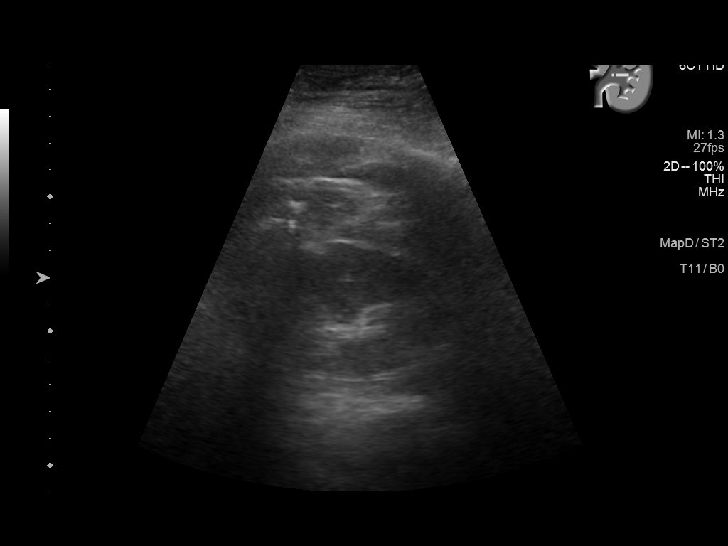
[im 22/29]
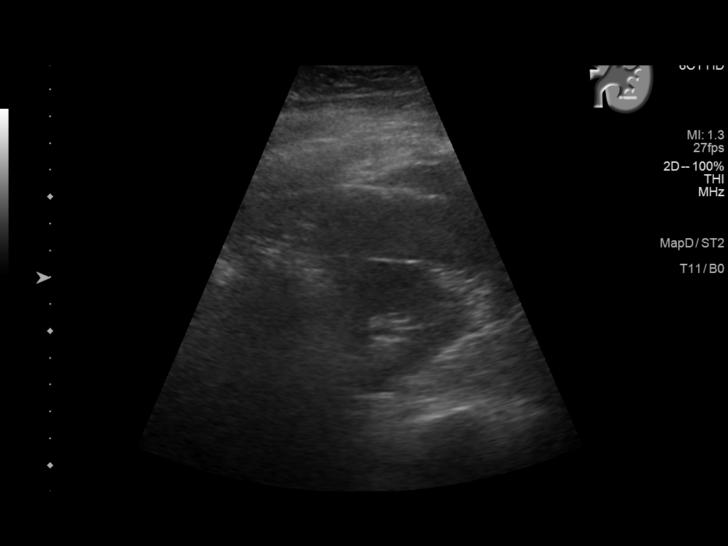
[im 24/29]
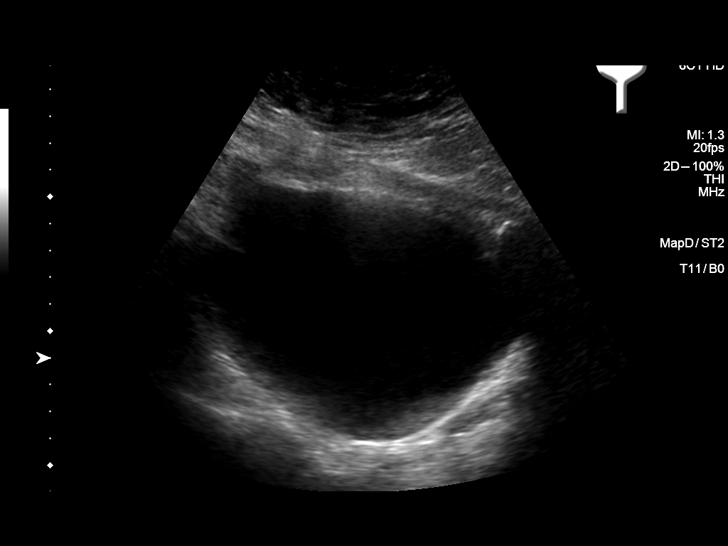
[im 26/29]
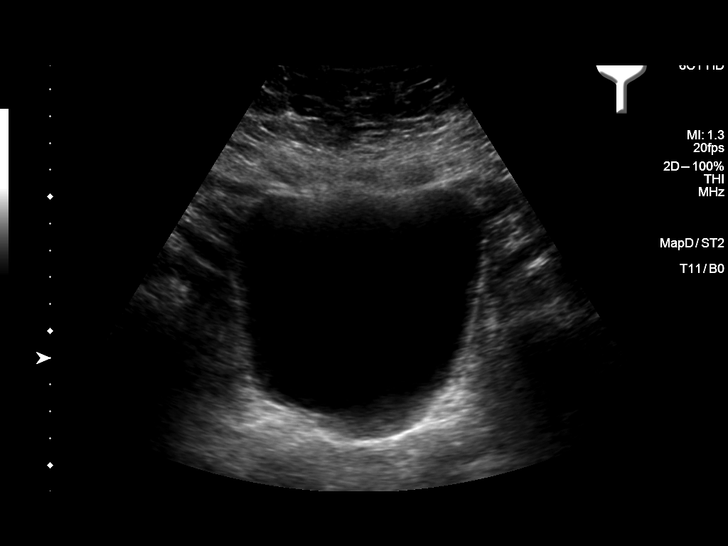
[im 29/29]
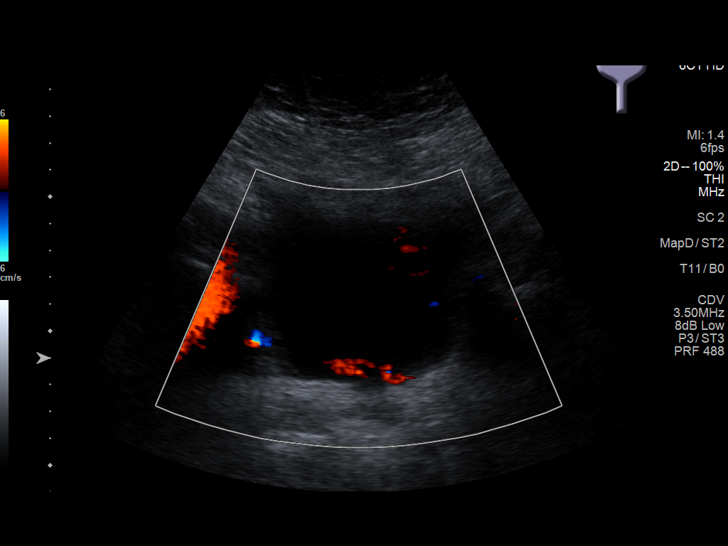

[14 of 25 positions shown; findings below may reference images not displayed]

FINDINGS: Right Kidney:

Renal measurements: 11.7 x 5.1 x 5.5 cm = volume: 171.9 mL .
Echogenicity and renal cortical thickness are within normal limits.
No mass, perinephric fluid, or hydronephrosis visualized. No
sonographically demonstrable calculus or ureterectasis.

Left Kidney:

Renal measurements: 11.4 x 5.7 x 5.2 cm = volume: 176.5 mL.
Echogenicity and renal cortical thickness are within normal limits.
No mass, perinephric fluid, or hydronephrosis visualized. No
sonographically demonstrable calculus or ureterectasis.

Bladder:

No urinary bladder wall thickening evident. Suspect mild debris in
the dependent portion of the bladder.

Other:

None.
IMPRESSION: Suspect mild debris and inferior portion of bladder without wall
thickening. There may be a mild cystitis. Correlation with
urinalysis in this regard advised.

Normal appearing kidneys bilaterally.

## 2020-09-10 ENCOUNTER — Other Ambulatory Visit: Payer: Self-pay | Admitting: Internal Medicine

## 2020-09-10 DIAGNOSIS — R42 Dizziness and giddiness: Secondary | ICD-10-CM

## 2020-09-10 DIAGNOSIS — Q0700 Arnold-Chiari syndrome without spina bifida or hydrocephalus: Secondary | ICD-10-CM

## 2020-09-12 ENCOUNTER — Ambulatory Visit: Payer: 59 | Admitting: Gastroenterology

## 2020-09-25 ENCOUNTER — Ambulatory Visit: Payer: 59

## 2020-10-31 ENCOUNTER — Other Ambulatory Visit: Payer: Self-pay | Admitting: Internal Medicine

## 2020-10-31 DIAGNOSIS — Z1231 Encounter for screening mammogram for malignant neoplasm of breast: Secondary | ICD-10-CM

## 2020-11-06 ENCOUNTER — Other Ambulatory Visit: Payer: Self-pay | Admitting: Internal Medicine

## 2020-11-06 DIAGNOSIS — N6320 Unspecified lump in the left breast, unspecified quadrant: Secondary | ICD-10-CM

## 2020-11-08 ENCOUNTER — Other Ambulatory Visit: Payer: Self-pay | Admitting: Internal Medicine

## 2020-11-08 DIAGNOSIS — N6320 Unspecified lump in the left breast, unspecified quadrant: Secondary | ICD-10-CM

## 2020-11-13 ENCOUNTER — Ambulatory Visit
Admission: RE | Admit: 2020-11-13 | Discharge: 2020-11-13 | Disposition: A | Discharge status: Home / Self Care | Payer: 59 | Source: Ambulatory Visit | Attending: Internal Medicine | Admitting: Internal Medicine

## 2020-11-13 ENCOUNTER — Other Ambulatory Visit: Payer: Self-pay

## 2020-11-13 DIAGNOSIS — N6320 Unspecified lump in the left breast, unspecified quadrant: Secondary | ICD-10-CM

## 2021-01-25 ENCOUNTER — Other Ambulatory Visit: Payer: Self-pay

## 2021-01-25 ENCOUNTER — Emergency Department
Admission: EM | Admit: 2021-01-25 | Discharge: 2021-01-25 | Disposition: A | Discharge status: Home / Self Care | Payer: BC Managed Care – PPO | Attending: Emergency Medicine | Admitting: Emergency Medicine

## 2021-01-25 ENCOUNTER — Emergency Department: Payer: BC Managed Care – PPO

## 2021-01-25 DIAGNOSIS — Z79899 Other long term (current) drug therapy: Secondary | ICD-10-CM | POA: Diagnosis not present

## 2021-01-25 DIAGNOSIS — Z87891 Personal history of nicotine dependence: Secondary | ICD-10-CM | POA: Insufficient documentation

## 2021-01-25 DIAGNOSIS — I10 Essential (primary) hypertension: Secondary | ICD-10-CM | POA: Insufficient documentation

## 2021-01-25 DIAGNOSIS — E119 Type 2 diabetes mellitus without complications: Secondary | ICD-10-CM | POA: Insufficient documentation

## 2021-01-25 DIAGNOSIS — N12 Tubulo-interstitial nephritis, not specified as acute or chronic: Secondary | ICD-10-CM | POA: Insufficient documentation

## 2021-01-25 DIAGNOSIS — B9689 Other specified bacterial agents as the cause of diseases classified elsewhere: Secondary | ICD-10-CM | POA: Diagnosis not present

## 2021-01-25 DIAGNOSIS — R509 Fever, unspecified: Secondary | ICD-10-CM | POA: Diagnosis present

## 2021-01-25 DIAGNOSIS — Z7984 Long term (current) use of oral hypoglycemic drugs: Secondary | ICD-10-CM | POA: Diagnosis not present

## 2021-01-25 LAB — BASIC METABOLIC PANEL
Anion gap: 10 (ref 5–15)
BUN: 8 mg/dL (ref 6–20)
CO2: 23 mmol/L (ref 22–32)
Calcium: 9.3 mg/dL (ref 8.9–10.3)
Chloride: 99 mmol/L (ref 98–111)
Creatinine, Ser: 0.83 mg/dL (ref 0.44–1.00)
GFR, Estimated: >60 mL/min (ref >60)
Glucose, Bld: 103 mg/dL — ABNORMAL HIGH (ref 70–99)
Potassium: 3.8 mmol/L (ref 3.5–5.1)
Sodium: 132 mmol/L — ABNORMAL LOW (ref 135–145)

## 2021-01-25 LAB — URINALYSIS, COMPLETE (UACMP) WITH MICROSCOPIC
Bilirubin Urine: NEGATIVE
Glucose, UA: NEGATIVE mg/dL
Hgb urine dipstick: NEGATIVE
Ketones, ur: NEGATIVE mg/dL
Leukocytes,Ua: NEGATIVE
Nitrite: NEGATIVE
Protein, ur: NEGATIVE mg/dL
Specific Gravity, Urine: 1.002 — ABNORMAL LOW (ref 1.005–1.030)
pH: 7 (ref 5.0–8.0)

## 2021-01-25 LAB — CBC
HCT: 35.8 % — ABNORMAL LOW (ref 36.0–46.0)
Hemoglobin: 11.9 g/dL — ABNORMAL LOW (ref 12.0–15.0)
MCH: 30.1 pg (ref 26.0–34.0)
MCHC: 33.2 g/dL (ref 30.0–36.0)
MCV: 90.4 fL (ref 80.0–100.0)
Platelets: 319 10*3/uL (ref 150–400)
RBC: 3.96 MIL/uL (ref 3.87–5.11)
RDW: 14.2 % (ref 11.5–15.5)
WBC: 13.6 10*3/uL — ABNORMAL HIGH (ref 4.0–10.5)
nRBC: 0 % (ref 0.0–0.2)

## 2021-01-25 MED ORDER — OXYCODONE HCL 5 MG PO TABS
5.0000 mg | ORAL_TABLET | Freq: Once | ORAL | Status: AC
  Administered 2021-01-25: 5 mg via ORAL
  Filled 2021-01-25: qty 1

## 2021-01-25 MED ORDER — LEVOFLOXACIN 750 MG PO TABS: 750.0000 mg | ORAL_TABLET | Freq: Every day | ORAL | 0 refills | Status: AC

## 2021-01-25 MED ORDER — PROMETHAZINE HCL 12.5 MG PO TABS: 12.5000 mg | ORAL_TABLET | Freq: Four times a day (QID) | ORAL | 0 refills | Status: DC | PRN

## 2021-01-25 MED ORDER — SODIUM CHLORIDE 0.9 % IV BOLUS
1000.0000 mL | Freq: Once | INTRAVENOUS | Status: AC
  Administered 2021-01-25: 1000 mL via INTRAVENOUS

## 2021-01-25 MED ORDER — LEVOFLOXACIN 750 MG PO TABS
750.0000 mg | ORAL_TABLET | Freq: Once | ORAL | Status: AC
  Administered 2021-01-25: 750 mg via ORAL
  Filled 2021-01-25: qty 1

## 2021-01-25 MED ORDER — SODIUM CHLORIDE 0.9 % IV SOLN
12.5000 mg | Freq: Four times a day (QID) | INTRAVENOUS | Status: DC | PRN
  Administered 2021-01-25: 12.5 mg via INTRAVENOUS
  Filled 2021-01-25 (×2): qty 0.5

## 2021-01-25 NOTE — ED Triage Notes (Signed)
Pt to ED with UTI since Thursday, states fever started Thursday morning, states has not been able to get fever below 103 starting today. Has taken tylenol at 1600.   Reports right sided flank pain, +nausea

## 2021-01-25 NOTE — ED Provider Notes (Signed)
[facility] Emergency Department Provider Note ____________________________________________   Event Date/Time   First MD Initiated Contact with Patient 01/25/21 2048     (approximate)  I have reviewed the triage vital signs and the nursing notes.   HISTORY  Chief Complaint Fever  HPI Amanda English is a 45 y.o. female with history of type 2 diabetes, hypertension, hyperlipidemia, and history as listed below presents to the emergency department for treatment and evaluation of UTI symptoms since Thursday.  She has had fevers since Thursday morning up to 103.  Some relief with Tylenol at 4:00.  Is having some right-sided flank pain and nausea.  No history of kidney stones.      Past Medical History:  Diagnosis Date   Anxiety    Arthritis    Chiari malformation    Complication of anesthesia    woke during one surgery   Depression    Diabetes mellitus without complication (HCC)    Diabetes mellitus, type II (HCC)    Family history of adverse reaction to anesthesia    Father - woke during one surgery   Fatty liver    GERD (gastroesophageal reflux disease)    High cholesterol    Hypertension    Migraine headache    approx 2x/month   Mitral valve regurgitation    Spina bifida occulta     Patient Active Problem List   Diagnosis Date Noted   Osteoarthritis of left knee 03/01/2019   Rectal bleeding    Sprain of knee and leg 08/20/2018   Plantar fasciitis 12/06/2015   Achilles tendonitis, bilateral 12/06/2015   Contusion of bone 08/31/2015   Loss of memory 07/14/2015   Chronic tension-type headache, not intractable 06/18/2015   Hyperlipidemia 04/17/2015   Migraine headache 03/23/2015   Tobacco abuse 04/14/2014   Headache 01/27/2014   Memory loss 01/27/2014   Sleep disorder 01/27/2014   Arthralgia 09/12/2013   H/O juvenile rheumatoid arthritis 09/12/2013   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   Obesity 09/12/2013   Personal history  of arthritis 09/12/2013   Bilateral hand pain 08/24/2013   Fibromyalgia 08/24/2013   Myalgia and myositis 08/24/2013   Right ankle swelling 08/24/2013   Rheumatoid arthritis (HCC) 08/24/2013   Diffuse connective tissue disease (HCC) 08/24/2013   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   Arnold-Chiari malformation (HCC) 08/08/2013   Depression 08/08/2013   Diabetes mellitus (HCC) 08/08/2013   Hypertension 08/08/2013    Past Surgical History:  Procedure Laterality Date   ABDOMINAL HYSTERECTOMY     APPENDECTOMY     CESAREAN SECTION     COLONOSCOPY WITH PROPOFOL N/A 10/04/2018   Procedure: COLONOSCOPY WITH PROPOFOL;  Surgeon: Toney Reil, MD;  Location: [facility] ENDOSCOPY;  Service: Gastroenterology;  Laterality: N/A;   ETHMOIDECTOMY Bilateral 11/17/2019   Procedure: TOTAL ETHMOIDECTOMY WITH FRONTAL SINUOTOMY;  Surgeon: Vernie Murders, MD;  Location: [facility];  Service: ENT;  Laterality: Bilateral;   IMAGE GUIDED SINUS SURGERY Bilateral 11/17/2019   Procedure: IMAGE GUIDED SINUS SURGERY;  Surgeon: Vernie Murders, MD;  Location: [facility];  Service: ENT;  Laterality: Bilateral;  need stryker disk GAVE DISK TO BRENDA 4-23  KP   MAXILLARY ANTROSTOMY Bilateral 11/17/2019   Procedure: MAXILLARY ANTROSTOMY;  Surgeon: Vernie Murders, MD;  Location: [facility];  Service: ENT;  Laterality: Bilateral;   NOSE SURGERY     STERNAL WIRE REMOVAL     thromectomy     thymus removal     TURBINATE  REDUCTION Bilateral 11/17/2019   Procedure: TURBINATE REDUCTION;  Surgeon: Vernie Murders, MD;  Location: [facility];  Service: ENT;  Laterality: Bilateral;  Diabetic - oral meds    Prior to Admission medications   Medication Sig Start Date End Date Taking? Authorizing Provider  levofloxacin (LEVAQUIN) 750 MG tablet Take 1 tablet (750 mg total) by mouth daily for 6 days. 01/25/21 01/31/21 Yes Quintel Mccalla B, FNP  promethazine (PHENERGAN) 12.5 MG tablet  Take 1 tablet (12.5 mg total) by mouth every 6 (six) hours as needed for nausea or vomiting. 01/25/21  Yes Julyssa Kyer B, FNP  ALPRAZolam (XANAX) 0.5 MG tablet Take 0.5 mg by mouth every 6 (six) hours as needed. 02/23/20   [provider]  atorvastatin (LIPITOR) 40 MG tablet atorvastatin 40 mg tablet    [provider]  furosemide (LASIX) 20 MG tablet Take 20 mg by mouth daily as needed.    [provider]  ketorolac (TORADOL) 10 MG tablet as needed.  06/21/14   [provider]  Lancets (ONETOUCH DELICA PLUS LANCET33G) MISC USE TO CHECK SUGARS ONCE DAILY DX E11.9 06/03/18   [provider]  loratadine (CLARITIN) 10 MG tablet Take by mouth.    [provider]  metFORMIN (GLUCOPHAGE) 1000 MG tablet Take 500 mg by mouth 2 (two) times daily with a meal. 500 mg AM, 1000 mg PM    [provider]  metoprolol tartrate (LOPRESSOR) 25 MG tablet Take 12.5 mg by mouth 2 (two) times daily. 03/18/20   [provider]  ondansetron (ZOFRAN) 4 MG tablet Take 4 mg by mouth every 8 (eight) hours as needed for nausea or vomiting.    [provider]  [facility] VERIO test strip USE TO CHECK SUGARS DAILY 06/04/18   [provider]  pantoprazole (PROTONIX) 40 MG tablet Take 40 mg by mouth daily.    [provider]  spironolactone (ALDACTONE) 25 MG tablet Take 25 mg by mouth daily.    [provider]  traZODone (DESYREL) 100 MG tablet trazodone 100 mg tablet 05/30/16   [provider]  Triamcinolone Acetonide (NASACORT AQ NA) Place into the nose daily as needed.    [provider]    Allergies Macrobid [nitrofurantoin], Ibuprofen, and Nsaids  Family History  Problem Relation Age of Onset   Depression Mother    Bipolar disorder Mother    Alzheimer's disease Father    Thyroid disease Brother    Breast cancer Maternal Aunt 26   Breast cancer Paternal Great-grandmother     Social History Social  History   Tobacco Use   Smoking status: Former    Packs/day: 0.50    Pack years: 0.00    Types: Cigarettes    Quit date: 07/21/2017    Years since quitting: 3.5   Smokeless tobacco: Never  Vaping Use   Vaping Use: Every day   Start date: 07/21/2017   Substances: Nicotine, Flavoring, Nicotine-salt   Devices: SMOK  Substance Use Topics   Alcohol use: No   Drug use: No    Review of Systems  Constitutional: Positive for fever/chills Eyes: No visual changes. ENT: No sore throat. Cardiovascular: Denies chest pain. Respiratory: Denies shortness of breath. Gastrointestinal: No abdominal pain.  Positive for nausea, no vomiting.  No diarrhea.  No constipation. Genitourinary: Positive for dysuria. Musculoskeletal: Positive for back pain. Skin: Negative for rash. Neurological: Negative for headaches, focal weakness or numbness. ____________________________________________   PHYSICAL EXAM:  VITAL SIGNS: ED Triage Vitals  Enc Vitals Group     BP 01/25/21 1821 112/70     Pulse Rate 01/25/21 1820 98     Resp 01/25/21 1821 18     Temp 01/25/21 1820 99 F (37.2 C)     Temp Source 01/25/21 1820 Oral     SpO2 01/25/21 1821 96 %     Weight 01/25/21 1821 220 lb (99.8 kg)     Height 01/25/21 1821 5\' 3"  (1.6 m)     Head Circumference --      Peak Flow --      Pain Score 01/25/21 1821 4     Pain Loc --      Pain Edu? --      Excl. in GC? --     Constitutional: Alert and oriented. Well appearing and in no acute distress. Eyes: Conjunctivae are normal. PERRL. EOMI. Head: Atraumatic. Nose: No congestion/rhinnorhea. Mouth/Throat: Mucous membranes are moist.  Oropharynx non-erythematous. Neck: No stridor.   Hematological/Lymphatic/Immunilogical: No cervical lymphadenopathy. Cardiovascular: Normal rate, regular rhythm. Grossly normal heart sounds.  Good peripheral circulation. Respiratory: Normal respiratory effort.  No retractions. Lungs CTAB. Gastrointestinal: Soft and nontender. No  distention. No abdominal bruits. No CVA tenderness. Genitourinary:  Musculoskeletal: No lower extremity tenderness nor edema.  No joint effusions. Neurologic:  Normal speech and language. No gross focal neurologic deficits are appreciated. No gait instability. Skin:  Skin is warm, dry and intact. No rash noted. Psychiatric: Mood and affect are normal. Speech and behavior are normal.  ____________________________________________   LABS (all labs ordered are listed, but only abnormal results are displayed)  Labs Reviewed  URINALYSIS, COMPLETE (UACMP) WITH MICROSCOPIC - Abnormal; Notable for the following components:      Result Value   Color, Urine YELLOW (*)    APPearance CLEAR (*)    Specific Gravity, Urine 1.002 (*)    Bacteria, UA RARE (*)    All other components within normal limits  BASIC METABOLIC PANEL - Abnormal; Notable for the following components:   Sodium 132 (*)    Glucose, Bld 103 (*)    All other components within normal limits  CBC - Abnormal; Notable for the following components:   WBC 13.6 (*)    Hemoglobin 11.9 (*)    HCT 35.8 (*)    All other components within normal limits   ____________________________________________  EKG  Not indicated. ____________________________________________  RADIOLOGY  ED MD interpretation:      I, 03/28/21, personally viewed and evaluated these images (plain radiographs) as part of my medical decision making, as well as reviewing the written report by the radiologist.  Official radiology report(s): CT ABDOMEN PELVIS WO CONTRAST  Result Date: 01/25/2021 CLINICAL DATA:  states fever started Thursday morning, states has not been able to get fever below 103 starting today. Flank pain, kidney stone suspected. Right side. Nausea. EXAM: CT ABDOMEN AND PELVIS WITHOUT CONTRAST TECHNIQUE: Multidetector CT imaging of the abdomen and pelvis was performed following the standard protocol without IV contrast. COMPARISON:  CT abdomen  pelvis 04/17/2020 FINDINGS: Lower chest: No acute abnormality. Hepatobiliary: The liver is enlarged measuring up to 18 cm. No focal liver abnormality. No gallstones, gallbladder wall thickening, or pericholecystic fluid. No biliary dilatation. Pancreas: No focal lesion. Normal pancreatic contour. No surrounding inflammatory changes. No main pancreatic ductal dilatation. Spleen: Normal in size without focal abnormality. Adrenals/Urinary Tract: No adrenal nodule bilaterally. Nonspecific perinephric stranding along the right kidney and renal pelvis with hazy contour of the renal parenchyma. No nephrolithiasis, no  hydronephrosis, and no contour-deforming renal mass. No ureterolithiasis or hydroureter. The urinary bladder is unremarkable. Stomach/Bowel: Stomach is within normal limits. No evidence of bowel wall thickening or dilatation. The appendix is not definitely identified. Vascular/Lymphatic: No abdominal aorta or iliac aneurysm. Mild atherosclerotic plaque of the aorta and its branches. No abdominal, pelvic, or inguinal lymphadenopathy. Reproductive: Status post hysterectomy. No adnexal masses. Other: No intraperitoneal free fluid. No intraperitoneal free gas. No organized fluid collection. Musculoskeletal: Tiny fat containing umbilical hernia. No suspicious lytic or blastic osseous lesions. No acute displaced fracture. IMPRESSION: Question right renal infection/inflammation. Recommend correlation with urinalysis. No nephroureterolithiasis. Limited evaluation on this noncontrast study. Hepatomegaly. Aortic Atherosclerosis (ICD10-I70.0). Electronically Signed   By: Tish Frederickson M.D.   On: 01/25/2021 21:57    ____________________________________________   PROCEDURES  Procedure(s) performed (including Critical Care):  Procedures  ____________________________________________   INITIAL IMPRESSION / ASSESSMENT AND PLAN     45 year old female presents to the ER treatment and evaluation of right flank  pain with nausea. See HPI.  While awaiting ER room assignment, protocol labs and urinalysis were collected. Urinalysis shows rare bacteria but otherwise reassuring.  DIFFERENTIAL DIAGNOSIS  Acute cystitis, Kidney stone  ED COURSE  CT abdomen and pelvis shows concern for pyelonephritis. Will treat with levaquin and discharge home. Patient agreeable to plan.    ___________________________________________   FINAL CLINICAL IMPRESSION(S) / ED DIAGNOSES  Final diagnoses:  Pyelonephritis     ED Discharge Orders          Ordered    promethazine (PHENERGAN) 12.5 MG tablet  Every 6 hours PRN        01/25/21 2319    levofloxacin (LEVAQUIN) 750 MG tablet  Daily        01/25/21 2319             Amanda English was evaluated in Emergency Department on 01/26/2021 for the symptoms described in the history of present illness. She was evaluated in the context of the global COVID-19 pandemic, which necessitated consideration that the patient might be at risk for infection with the SARS-CoV-2 virus that causes COVID-19. Institutional protocols and algorithms that pertain to the evaluation of patients at risk for COVID-19 are in a state of rapid change based on information released by regulatory bodies including the CDC and federal and state organizations. These policies and algorithms were followed during the patient's care in the ED.   Note:  This document was prepared using Dragon voice recognition software and may include unintentional dictation errors.    Chinita Pester, FNP 01/26/21 1947    Chesley Noon, MD 01/29/21 1739

## 2021-01-25 NOTE — Discharge Instructions (Addendum)
Follow up with primary care on Monday if not improving.  Return to the ER for symptoms that change or worsen if unable to schedule an appointment.  Stop the antibiotic you were prescribed by primary care and only take the Levaquin.

## 2021-01-25 NOTE — ED Notes (Signed)
Assessment: Patient complains of right lower back discomfort that radiates around the right flank and into the right lowe abdominal quadrant

## 2021-12-02 IMAGING — CT CT ABD-PELV W/O CM
2 of 4 series · 16 of 46 positions shown, 18 images · non-contrast
Comparison: CT abdomen pelvis 04/17/2020

CLINICAL DATA: states fever started [REDACTED] morning, states has
not been able to get fever below 103 starting today. Flank pain,
kidney stone suspected. Right side. Nausea.

EXAM:
CT ABDOMEN AND PELVIS WITHOUT CONTRAST
TECHNIQUE: Multidetector CT imaging of the abdomen and pelvis was performed
following the standard protocol without IV contrast.

[Series 2: routine abd/pel wo · axial · 0.98mm/px · z∈[-699,-264]mm · 13 of 97 slices shown, 15 images]
[im 5/97  soft-tissue]
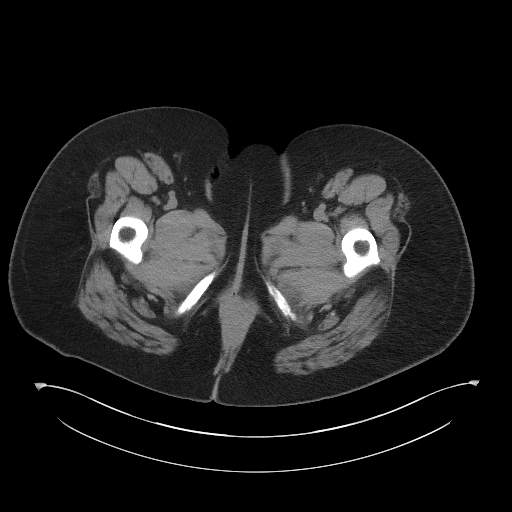
[im 5/97  bone]
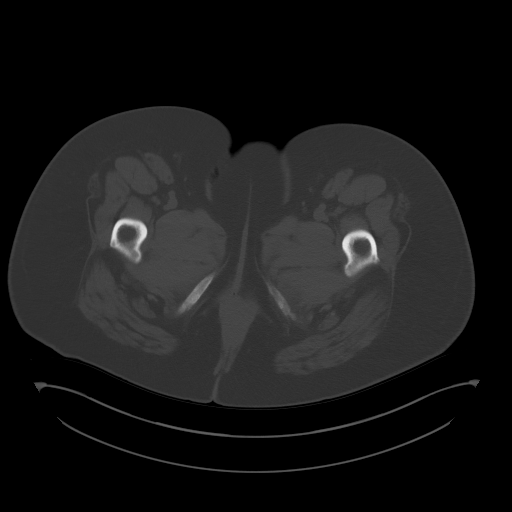
[im 13/97  soft-tissue]
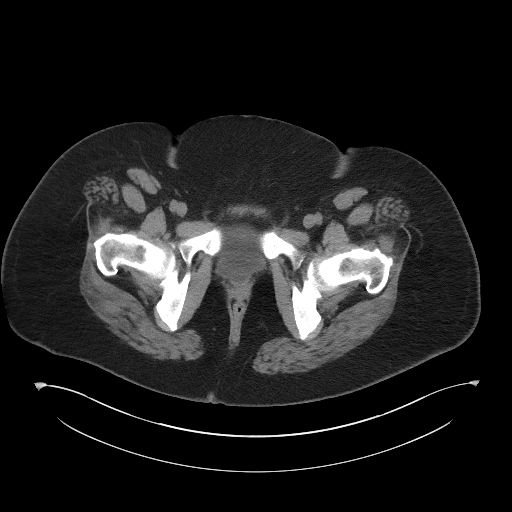
[im 21/97  soft-tissue]
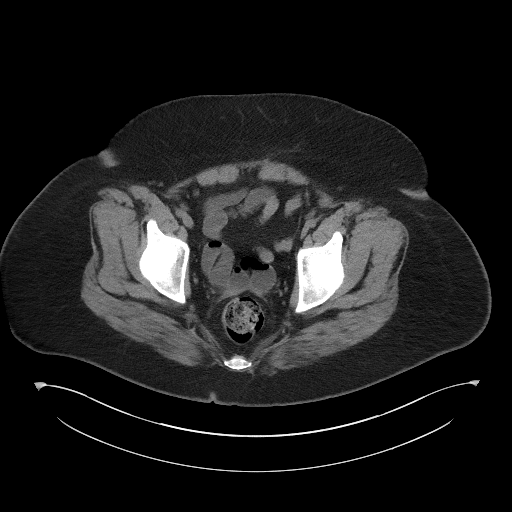
[im 26/97  soft-tissue]
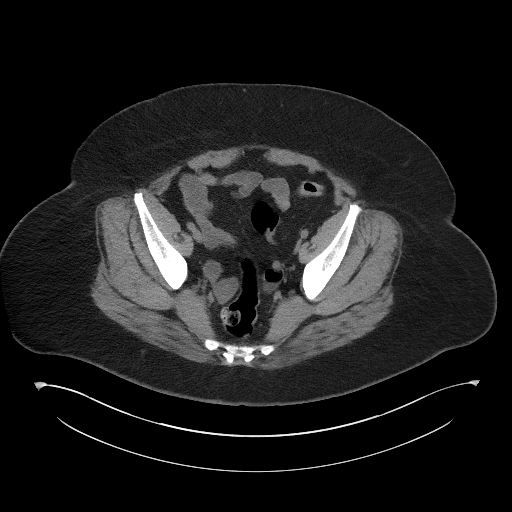
[im 34/97  soft-tissue]
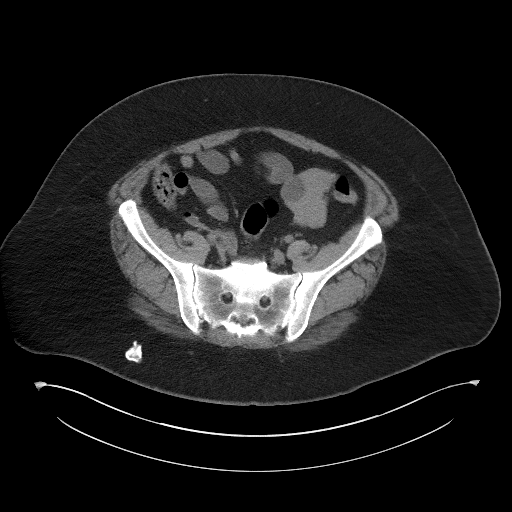
[im 42/97  soft-tissue]
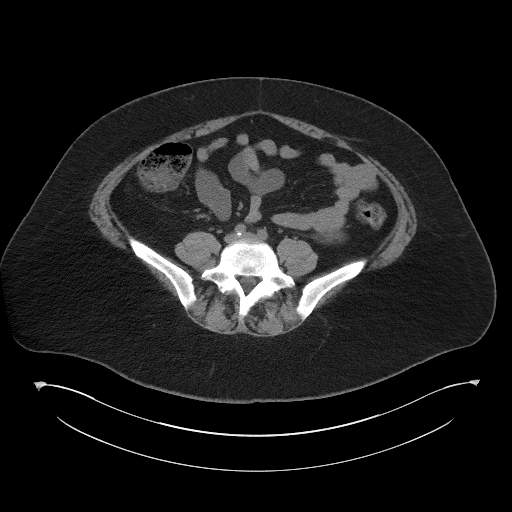
[im 51/97  soft-tissue]
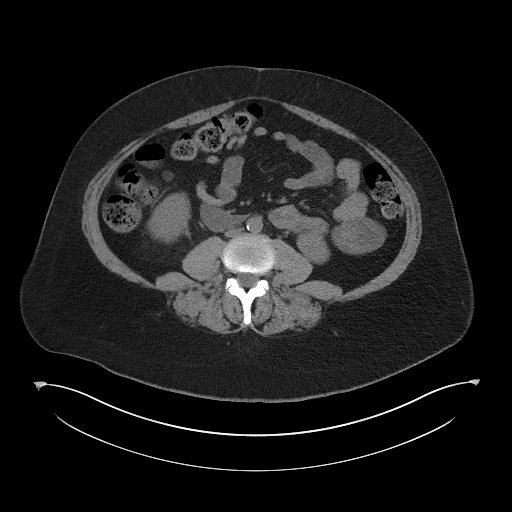
[im 55/97  soft-tissue]
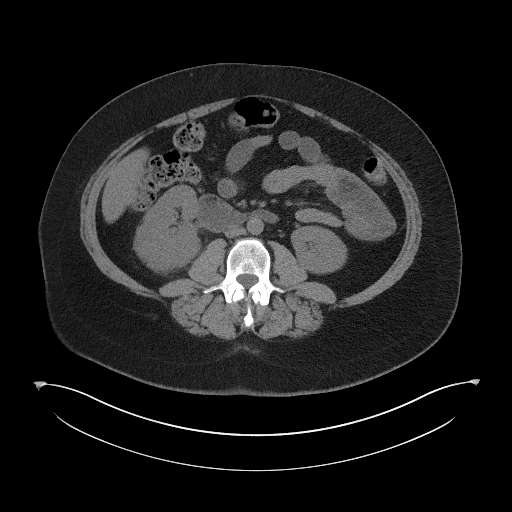
[im 63/97  soft-tissue]
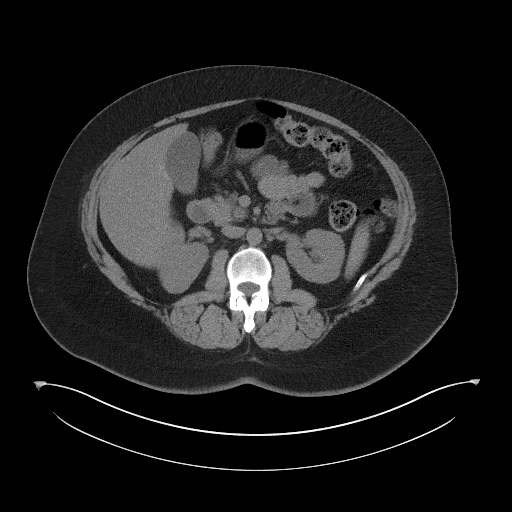
[im 63/97  bone]
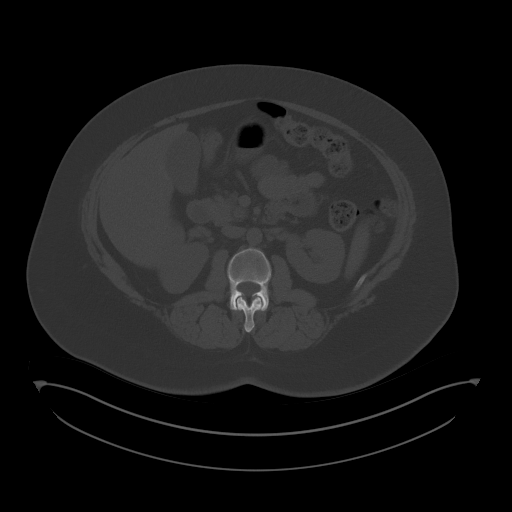
[im 71/97  soft-tissue]
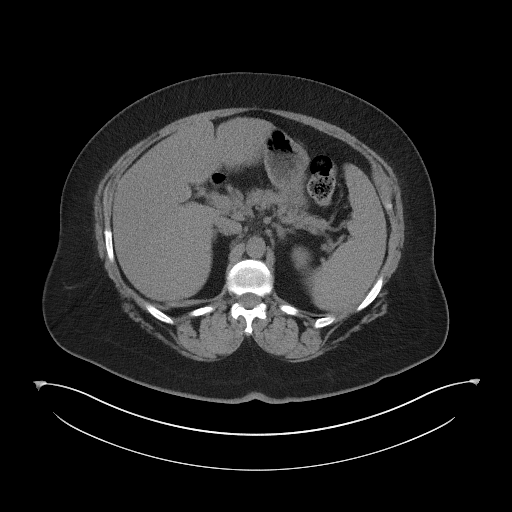
[im 76/97  soft-tissue]
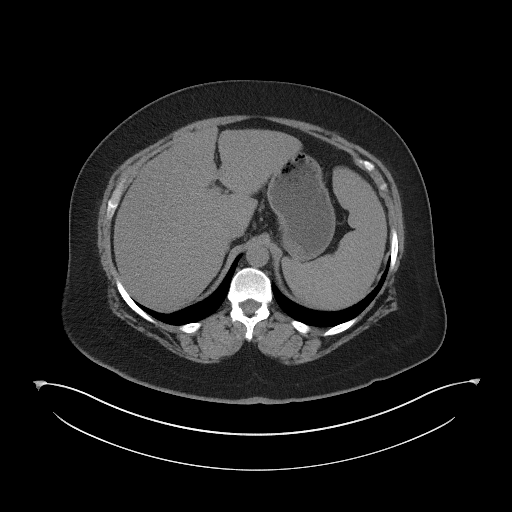
[im 84/97  soft-tissue]
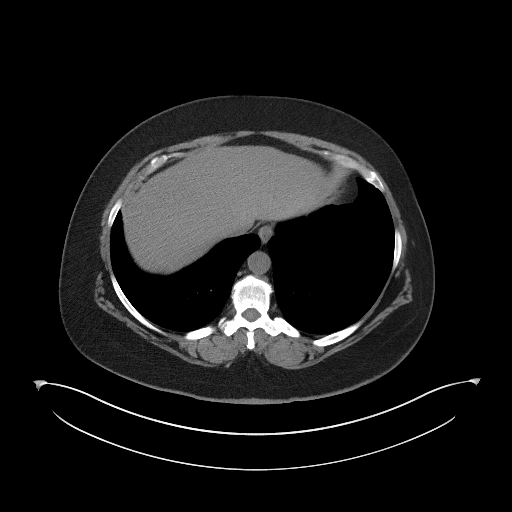
[im 92/97  soft-tissue]
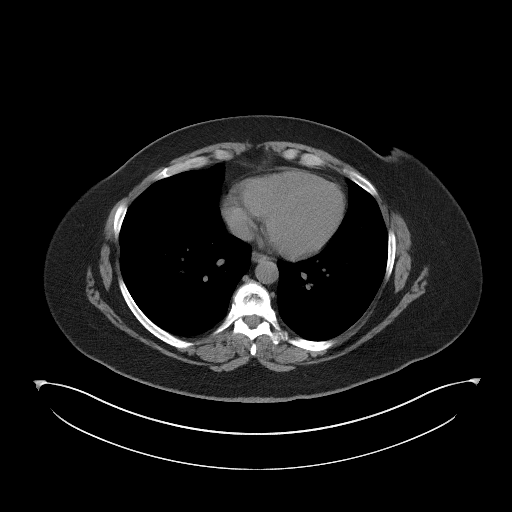

[Series 5: coronal st · coronal · 0.74mm/px · 3 of 113 slices shown]
[im 38/113  soft-tissue]
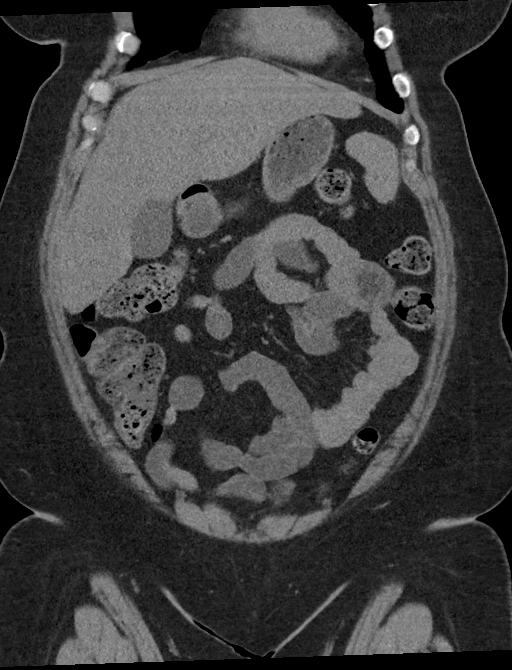
[im 50/113  soft-tissue]
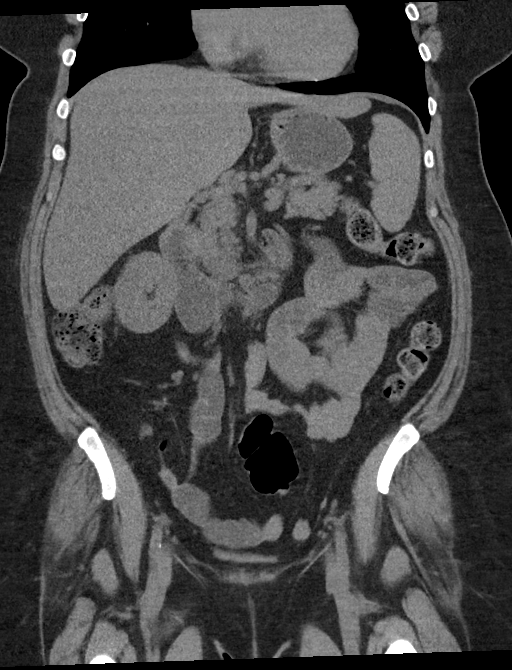
[im 63/113  soft-tissue]
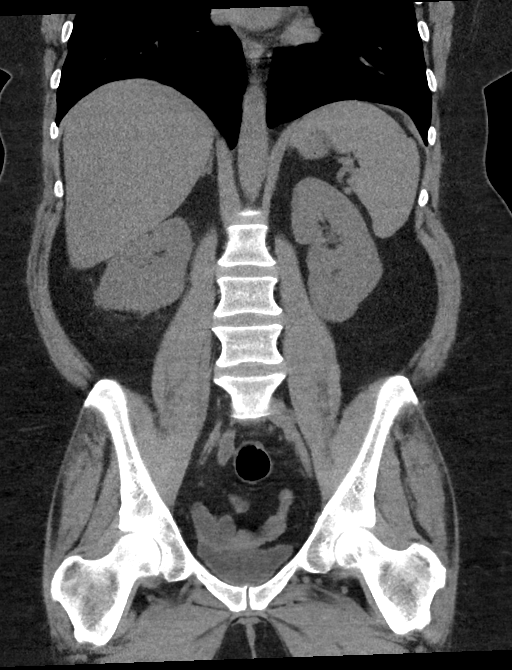

[16 of 46 positions shown; findings below may reference images not displayed]

FINDINGS: Lower chest: No acute abnormality.

Hepatobiliary: The liver is enlarged measuring up to 18 cm. No focal
liver abnormality. No gallstones, gallbladder wall thickening, or
pericholecystic fluid. No biliary dilatation.

Pancreas: No focal lesion. Normal pancreatic contour. No surrounding
inflammatory changes. No main pancreatic ductal dilatation.

Spleen: Normal in size without focal abnormality.

Adrenals/Urinary Tract:

No adrenal nodule bilaterally.

Nonspecific perinephric stranding along the right kidney and renal
pelvis with hazy contour of the renal parenchyma. No
nephrolithiasis, no hydronephrosis, and no contour-deforming renal
mass. No ureterolithiasis or hydroureter.

The urinary bladder is unremarkable.

Stomach/Bowel: Stomach is within normal limits. No evidence of bowel
wall thickening or dilatation. The appendix is not definitely
identified.

Vascular/Lymphatic: No abdominal aorta or iliac aneurysm. Mild
atherosclerotic plaque of the aorta and its branches. No abdominal,
pelvic, or inguinal lymphadenopathy.

Reproductive: Status post hysterectomy. No adnexal masses.

Other: No intraperitoneal free fluid. No intraperitoneal free gas.
No organized fluid collection.

Musculoskeletal:

Tiny fat containing umbilical hernia.

No suspicious lytic or blastic osseous lesions. No acute displaced
fracture.
IMPRESSION: Question right renal infection/inflammation. Recommend correlation
with urinalysis. No nephroureterolithiasis. Limited evaluation on
this noncontrast study.

Hepatomegaly.

Aortic Atherosclerosis ([id number]).

## 2022-08-20 ENCOUNTER — Other Ambulatory Visit: Payer: Self-pay | Admitting: Physician Assistant

## 2022-08-20 ENCOUNTER — Other Ambulatory Visit: Payer: Self-pay

## 2022-08-20 DIAGNOSIS — R519 Headache, unspecified: Secondary | ICD-10-CM

## 2022-08-20 DIAGNOSIS — R42 Dizziness and giddiness: Secondary | ICD-10-CM

## 2022-08-20 DIAGNOSIS — I10 Essential (primary) hypertension: Secondary | ICD-10-CM

## 2022-08-21 ENCOUNTER — Other Ambulatory Visit: Payer: Self-pay | Admitting: Physician Assistant

## 2022-08-21 ENCOUNTER — Ambulatory Visit
Admission: RE | Admit: 2022-08-21 | Discharge: 2022-08-21 | Disposition: A | Discharge status: Home / Self Care | Payer: BC Managed Care – PPO | Source: Ambulatory Visit | Attending: Physician Assistant | Admitting: Physician Assistant

## 2022-08-21 DIAGNOSIS — R519 Headache, unspecified: Secondary | ICD-10-CM | POA: Insufficient documentation

## 2022-08-21 DIAGNOSIS — R42 Dizziness and giddiness: Secondary | ICD-10-CM

## 2022-08-21 MED ORDER — GADOBUTROL 1 MMOL/ML IV SOLN
10.0000 mL | Freq: Once | INTRAVENOUS | Status: AC | PRN
  Administered 2022-08-21: 10 mL via INTRAVENOUS

## 2022-09-01 ENCOUNTER — Telehealth: Payer: Self-pay | Admitting: *Deleted

## 2022-09-01 NOTE — Telephone Encounter (Signed)
Nurse placed call to patient to review appointment details for upcoming new patient consultation visit. Patient confirmed appointment time and details. Patient denies any questions or concerns regarding visit.

## 2022-09-02 ENCOUNTER — Encounter: Payer: Self-pay | Admitting: Oncology

## 2022-09-02 ENCOUNTER — Inpatient Hospital Stay: Payer: BC Managed Care – PPO | Attending: Oncology | Admitting: Oncology

## 2022-09-02 ENCOUNTER — Inpatient Hospital Stay: Payer: BC Managed Care – PPO

## 2022-09-02 VITALS — BP 113/70 | HR 75 | Temp 98.4°F | Resp 18 | Wt 250.2 lb

## 2022-09-02 DIAGNOSIS — M0800 Unspecified juvenile rheumatoid arthritis of unspecified site: Secondary | ICD-10-CM | POA: Insufficient documentation

## 2022-09-02 DIAGNOSIS — Z808 Family history of malignant neoplasm of other organs or systems: Secondary | ICD-10-CM | POA: Diagnosis not present

## 2022-09-02 DIAGNOSIS — Z8739 Personal history of other diseases of the musculoskeletal system and connective tissue: Secondary | ICD-10-CM | POA: Diagnosis not present

## 2022-09-02 DIAGNOSIS — R61 Generalized hyperhidrosis: Secondary | ICD-10-CM | POA: Diagnosis not present

## 2022-09-02 DIAGNOSIS — Z87891 Personal history of nicotine dependence: Secondary | ICD-10-CM | POA: Insufficient documentation

## 2022-09-02 DIAGNOSIS — D72829 Elevated white blood cell count, unspecified: Secondary | ICD-10-CM | POA: Diagnosis present

## 2022-09-02 DIAGNOSIS — Z809 Family history of malignant neoplasm, unspecified: Secondary | ICD-10-CM

## 2022-09-02 DIAGNOSIS — R232 Flushing: Secondary | ICD-10-CM | POA: Diagnosis not present

## 2022-09-02 DIAGNOSIS — R5383 Other fatigue: Secondary | ICD-10-CM

## 2022-09-02 DIAGNOSIS — Z806 Family history of leukemia: Secondary | ICD-10-CM | POA: Diagnosis not present

## 2022-09-02 DIAGNOSIS — Z803 Family history of malignant neoplasm of breast: Secondary | ICD-10-CM | POA: Insufficient documentation

## 2022-09-02 DIAGNOSIS — E559 Vitamin D deficiency, unspecified: Secondary | ICD-10-CM | POA: Insufficient documentation

## 2022-09-02 LAB — CBC WITH DIFFERENTIAL/PLATELET
Abs Immature Granulocytes: 0.05 10*3/uL (ref 0.00–0.07)
Basophils Absolute: 0 10*3/uL (ref 0.0–0.1)
Basophils Relative: 0 %
Eosinophils Absolute: 0.1 10*3/uL (ref 0.0–0.5)
Eosinophils Relative: 1 %
HCT: 38.1 % (ref 36.0–46.0)
Hemoglobin: 12.1 g/dL (ref 12.0–15.0)
Immature Granulocytes: 1 %
Lymphocytes Relative: 14 %
Lymphs Abs: 1.4 10*3/uL (ref 0.7–4.0)
MCH: 29.2 pg (ref 26.0–34.0)
MCHC: 31.8 g/dL (ref 30.0–36.0)
MCV: 91.8 fL (ref 80.0–100.0)
Monocytes Absolute: 0.5 10*3/uL (ref 0.1–1.0)
Monocytes Relative: 5 %
Neutro Abs: 7.7 10*3/uL (ref 1.7–7.7)
Neutrophils Relative %: 79 %
Platelets: 321 10*3/uL (ref 150–400)
RBC: 4.15 MIL/uL (ref 3.87–5.11)
RDW: 14 % (ref 11.5–15.5)
WBC: 9.7 10*3/uL (ref 4.0–10.5)
nRBC: 0 % (ref 0.0–0.2)

## 2022-09-02 LAB — COMPREHENSIVE METABOLIC PANEL
ALT: 20 U/L (ref 0–44)
AST: 18 U/L (ref 15–41)
Albumin: 4.2 g/dL (ref 3.5–5.0)
Alkaline Phosphatase: 96 U/L (ref 38–126)
Anion gap: 8 (ref 5–15)
BUN: 11 mg/dL (ref 6–20)
CO2: 27 mmol/L (ref 22–32)
Calcium: 9.5 mg/dL (ref 8.9–10.3)
Chloride: 104 mmol/L (ref 98–111)
Creatinine, Ser: 0.64 mg/dL (ref 0.44–1.00)
GFR, Estimated: >60 mL/min (ref >60)
Glucose, Bld: 104 mg/dL — ABNORMAL HIGH (ref 70–99)
Potassium: 3.5 mmol/L (ref 3.5–5.1)
Sodium: 139 mmol/L (ref 135–145)
Total Bilirubin: 0.2 mg/dL — ABNORMAL LOW (ref 0.3–1.2)
Total Protein: 7.8 g/dL (ref 6.5–8.1)

## 2022-09-02 LAB — FOLATE: Folate: 15.9 ng/mL (ref >5.9)

## 2022-09-02 LAB — FERRITIN: Ferritin: 34 ng/mL (ref 11–307)

## 2022-09-02 LAB — HEPATITIS PANEL, ACUTE
HCV Ab: NONREACTIVE
Hep A IgM: NONREACTIVE
Hep B C IgM: NONREACTIVE
Hepatitis B Surface Ag: NONREACTIVE

## 2022-09-02 LAB — IRON AND TIBC
Iron: 55 ug/dL (ref 28–170)
Saturation Ratios: 12 % (ref 10.4–31.8)
TIBC: 469 ug/dL — ABNORMAL HIGH (ref 250–450)
UIBC: 414 ug/dL

## 2022-09-02 LAB — C-REACTIVE PROTEIN: CRP: 2.4 mg/dL — ABNORMAL HIGH (ref <1.0)

## 2022-09-02 LAB — HIV ANTIBODY (ROUTINE TESTING W REFLEX): HIV Screen 4th Generation wRfx: NONREACTIVE

## 2022-09-02 LAB — LACTATE DEHYDROGENASE: LDH: 135 U/L (ref 98–192)

## 2022-09-02 LAB — SEDIMENTATION RATE: Sed Rate: 22 mm/hr — ABNORMAL HIGH (ref 0–20)

## 2022-09-02 NOTE — Progress Notes (Signed)
Hematology/Oncology Consult note [phone] [phone]        REFERRING PROVIDER: Gladstone Lighter, MD   CHIEF COMPLAINTS/REASON FOR VISIT:  Evaluation of    ASSESSMENT & PLAN:   Family history of cancer Recommend genetic testing. She declines.   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 Check CBC, smear, multiple myeloma panel, light chain ratio, flow cytometry, LDH, HIV, hepatitis, ANA, CRP, ESR.  Fatigue TSH was normal. Check iron, TIBC ferritin, vitamin D level,  H/O juvenile rheumatoid arthritis Pending above workup.  Orders Placed This Encounter  Procedures   CBC with Differential/Platelet    Standing Status:   Future    Number of Occurrences:   1    Standing Expiration Date:   09/03/2023   Comprehensive metabolic panel    Standing Status:   Future    Number of Occurrences:   1    Standing Expiration Date:   09/03/2023   Multiple Myeloma Panel (SPEP&IFE w/QIG)    Standing Status:   Future    Number of Occurrences:   1    Standing Expiration Date:   09/03/2023   Kappa/lambda light chains    Standing Status:   Future    Number of Occurrences:   1    Standing Expiration Date:   09/03/2023   Flow cytometry panel-leukemia/lymphoma work-up    Standing Status:   Future    Number of Occurrences:   1    Standing Expiration Date:   09/03/2023   Lactate dehydrogenase    Standing Status:   Future    Number of Occurrences:   1    Standing Expiration Date:   09/03/2023   Folate    Standing Status:   Future    Number of Occurrences:   1    Standing Expiration Date:   09/03/2023   Ferritin    Standing Status:   Future    Number of Occurrences:   1    Standing Expiration Date:   03/03/2023   Iron and TIBC    Standing Status:   Future    Number of Occurrences:   1    Standing Expiration Date:   09/03/2023   HIV Antibody (routine testing w rflx)    Standing Status:   Future    Number of Occurrences:   1    Standing Expiration Date:    09/03/2023   Hepatitis panel, acute    Standing Status:   Future    Number of Occurrences:   1    Standing Expiration Date:   09/03/2023   VITAMIN D 25 Hydroxy (Vit-D Deficiency, Fractures)    Standing Status:   Future    Number of Occurrences:   1    Standing Expiration Date:   09/03/2023   ANA w/Reflex    Standing Status:   Future    Number of Occurrences:   1    Standing Expiration Date:   09/03/2023   C-reactive protein    Standing Status:   Future    Number of Occurrences:   1    Standing Expiration Date:   09/03/2023   Sedimentation rate    Standing Status:   Future    Number of Occurrences:   1    Standing Expiration Date:   09/02/2023   Follow-up in 3-4 weeks All questions were answered. The patient knows to call the clinic with any problems, questions or concerns.  Earlie Server, MD, PhD [facility] Hematology Oncology 09/02/2022   HISTORY  OF PRESENTING ILLNESS:   Amanda English is a  47 y.o.  female with PMH listed below was seen in consultation at the request of  Gladstone Lighter, MD  for evaluation of leukocytosis Reviewed patient's previous blood work. Patient has a history of  leukocytosis since 2016.  Predominantly neutrophilia.  Patient reports having COVID 19 infection in December 2023 and since then she has felt very tired, lack of energy.  She describes the fatigue level as severe as when she needed blood transfusion in the past.   Denies any intentional weight loss, fever.  She endorses mild night sweats 3-4 times per week, which has been a chronic issue for her and has improved comparing to her symptoms right after complete hysterectomy..  Occasional hot flash.  Patient has a history of juvenile rheumatoid arthritis for which she used to see rheumatology.  She has not needed to see rheumatology for a long time.  She has some joint discomfort but overall manageable. Denies any chronic steroid use She is a former smoker, quit in 2018.  MEDICAL HISTORY:  Past Medical  History:  Diagnosis Date   Anxiety    Arthritis    Chiari malformation    Complication of anesthesia    woke during one surgery   Depression    Diabetes mellitus without complication ([place])    Diabetes mellitus, type II ([place])    Family history of adverse reaction to anesthesia    Father - woke during one surgery   Fatty liver    GERD (gastroesophageal reflux disease)    High cholesterol    Hypertension    Migraine headache    approx 2x/month   Mitral valve regurgitation    Spina bifida occulta     SURGICAL HISTORY: Past Surgical History:  Procedure Laterality Date   ABDOMINAL HYSTERECTOMY     APPENDECTOMY     CESAREAN SECTION     COLONOSCOPY WITH PROPOFOL N/A 10/04/2018   Procedure: COLONOSCOPY WITH PROPOFOL;  Surgeon: Lin Landsman, MD;  Location: [facility];  Service: Gastroenterology;  Laterality: N/A;   ETHMOIDECTOMY Bilateral 11/17/2019   Procedure: TOTAL ETHMOIDECTOMY WITH FRONTAL SINUOTOMY;  Surgeon: Margaretha Sheffield, MD;  Location: [place];  Service: ENT;  Laterality: Bilateral;   IMAGE GUIDED SINUS SURGERY Bilateral 11/17/2019   Procedure: IMAGE GUIDED SINUS SURGERY;  Surgeon: Margaretha Sheffield, MD;  Location: [place];  Service: ENT;  Laterality: Bilateral;  need stryker disk GAVE DISK TO BRENDA 4-23  KP   MAXILLARY ANTROSTOMY Bilateral 11/17/2019   Procedure: MAXILLARY ANTROSTOMY;  Surgeon: Margaretha Sheffield, MD;  Location: [place];  Service: ENT;  Laterality: Bilateral;   NOSE SURGERY     STERNAL WIRE REMOVAL     thromectomy     thymus removal     TURBINATE REDUCTION Bilateral 11/17/2019   Procedure: TURBINATE REDUCTION;  Surgeon: Margaretha Sheffield, MD;  Location: [place];  Service: ENT;  Laterality: Bilateral;  Diabetic - oral meds    SOCIAL HISTORY: Social History   Socioeconomic History   Marital status: Married    Spouse name: Not on file   Number of children: Not on file   Years of education: Not on file    Highest education level: Not on file  Occupational History   Not on file  Tobacco Use   Smoking status: Former    Packs/day: 0.50    Years: 12.00    Total pack years: 6.00    Types: Cigarettes    Quit  date: 2018    Years since quitting: 6.1   Smokeless tobacco: Never  Vaping Use   Vaping Use: Former   Quit date: 09/02/2016   Substances: Nicotine, Flavoring, Nicotine-salt   Devices: SMOK  Substance and Sexual Activity   Alcohol use: No   Drug use: No   Sexual activity: Yes  Other Topics Concern   Not on file  Social History Narrative   Not on file   Social Determinants of Health   Financial Resource Strain: Not on file  Food Insecurity: No Food Insecurity (09/02/2022)   Hunger Vital Sign    Worried About Running Out of Food in the Last Year: Never true    Ran Out of Food in the Last Year: Never true  Transportation Needs: No Transportation Needs (09/02/2022)   PRAPARE - Hydrologist (Medical): No    Lack of Transportation (Non-Medical): No  Physical Activity: Not on file  Stress: Not on file  Social Connections: Not on file  Intimate Partner Violence: Not At Risk (09/02/2022)   Humiliation, Afraid, Rape, and Kick questionnaire    Fear of Current or Ex-Partner: No    Emotionally Abused: No    Physically Abused: No    Sexually Abused: No    FAMILY HISTORY: Family History  Problem Relation Age of Onset   Depression Mother    Bipolar disorder Mother    Alzheimer's disease Father    Thyroid disease Brother    Breast cancer Maternal Aunt 45   Thyroid cancer Maternal Grandmother    Leukemia Paternal Grandmother     ALLERGIES:  is allergic to macrobid [nitrofurantoin], ibuprofen, and nsaids.  MEDICATIONS:  Current Outpatient Medications  Medication Sig Dispense Refill   ALPRAZolam (XANAX) 0.5 MG tablet Take 0.5 mg by mouth every 6 (six) hours as needed.     amLODipine (NORVASC) 2.5 MG tablet Take 2.5 mg by mouth daily.      cyanocobalamin (VITAMIN B12) 1000 MCG/ML injection Inject 1,000 mcg into the muscle every 30 (thirty) days.     escitalopram (LEXAPRO) 20 MG tablet Take 20 mg by mouth daily.     ketorolac (TORADOL) 10 MG tablet as needed.   0   Lancets (ONETOUCH DELICA PLUS 123XX123) MISC USE TO CHECK SUGARS ONCE DAILY DX E11.9     ondansetron (ZOFRAN) 4 MG tablet Take 4 mg by mouth every 8 (eight) hours as needed for nausea or vomiting.     ONETOUCH VERIO test strip USE TO CHECK SUGARS DAILY     Semaglutide,0.25 or 0.5MG/DOS, 2 MG/3ML SOPN Inject into the skin. Inject 0.75 mLs (0.5 mg total) subcutaneously once a week     traZODone (DESYREL) 100 MG tablet trazodone 100 mg tablet     Triamcinolone Acetonide (NASACORT AQ NA) Place into the nose daily as needed.     Current Facility-Administered Medications  Medication Dose Route Frequency Provider Last Rate Last Admin   triamcinolone acetonide (KENALOG) 10 MG/ML injection 10 mg  10 mg Other Once Trula Slade, DPM       triamcinolone acetonide (KENALOG) 10 MG/ML injection 10 mg  10 mg Other Once Trula Slade, DPM        Review of Systems  Constitutional:  Positive for fatigue. Negative for appetite change, chills and fever.  HENT:   Negative for hearing loss and voice change.   Eyes:  Negative for eye problems.  Respiratory:  Negative for chest tightness and cough.   Cardiovascular:  Negative for chest pain.  Gastrointestinal:  Negative for abdominal distention, abdominal pain and blood in stool.  Endocrine: Negative for hot flashes.  Genitourinary:  Negative for difficulty urinating and frequency.   Musculoskeletal:  Negative for arthralgias.  Skin:  Negative for itching and rash.  Neurological:  Negative for extremity weakness.  Hematological:  Negative for adenopathy.  Psychiatric/Behavioral:  Negative for confusion.    PHYSICAL EXAMINATION: ECOG PERFORMANCE STATUS: 1 - Symptomatic but completely ambulatory Vitals:   09/02/22 0932   BP: 113/70  Pulse: 75  Resp: 18  Temp: 98.4 F (36.9 C)   Filed Weights   09/02/22 0932  Weight: 250 lb 3.2 oz (113.5 kg)    Physical Exam Constitutional:      General: She is not in acute distress.    Appearance: She is obese.  HENT:     Head: Normocephalic and atraumatic.  Eyes:     General: No scleral icterus. Cardiovascular:     Rate and Rhythm: Normal rate and regular rhythm.     Heart sounds: Normal heart sounds.  Pulmonary:     Effort: Pulmonary effort is normal. No respiratory distress.     Breath sounds: No wheezing.  Abdominal:     General: Bowel sounds are normal. There is no distension.     Palpations: Abdomen is soft.  Musculoskeletal:        General: No deformity. Normal range of motion.     Cervical back: Normal range of motion and neck supple.  Skin:    General: Skin is warm and dry.     Findings: No erythema or rash.  Neurological:     Mental Status: She is alert and oriented to person, place, and time. Mental status is at baseline.     Cranial Nerves: No cranial nerve deficit.     Coordination: Coordination normal.  Psychiatric:        Mood and Affect: Mood normal.     LABORATORY DATA:  I have reviewed the data as listed    Latest Ref Rng & Units 09/02/2022   10:12 AM 01/25/2021    6:24 PM 08/27/2020   10:05 PM  CBC  WBC 4.0 - 10.5 K/uL 9.7  13.6  11.0   Hemoglobin 12.0 - 15.0 g/dL 12.1  11.9  11.6   Hematocrit 36.0 - 46.0 % 38.1  35.8  36.6   Platelets 150 - 400 K/uL 321  319  317       Latest Ref Rng & Units 09/02/2022   10:12 AM 01/25/2021    6:24 PM 08/27/2020   10:05 PM  CMP  Glucose 70 - 99 mg/dL 104  103  89   BUN 6 - 20 mg/dL 11  8  9   $ Creatinine 0.44 - 1.00 mg/dL 0.64  0.83  0.72   Sodium 135 - 145 mmol/L 139  132  136   Potassium 3.5 - 5.1 mmol/L 3.5  3.8  3.9   Chloride 98 - 111 mmol/L 104  99  99   CO2 22 - 32 mmol/L 27  23  25   $ Calcium 8.9 - 10.3 mg/dL 9.5  9.3  9.3   Total Protein 6.5 - 8.1 g/dL 7.8   7.1   Total  Bilirubin 0.3 - 1.2 mg/dL 0.2   0.9   Alkaline Phos 38 - 126 U/L 96   84   AST 15 - 41 U/L 18   27   ALT 0 - 44 U/L 20  33       RADIOGRAPHIC STUDIES: I have personally reviewed the radiological images as listed and agreed with the findings in the report. MR BRAIN W WO CONTRAST  Result Date: 08/21/2022 CLINICAL DATA:  Provided history: Acute intractable headache, unspecified headache type. Dizziness and giddiness. Additional history provided by the scanning technologist: The patient reports headaches and dizziness since suffering a fall, ringing in both ears, blurry vision. EXAM: MRI HEAD WITHOUT AND WITH CONTRAST TECHNIQUE: Multiplanar, multiecho pulse sequences of the brain and surrounding structures were obtained without and with intravenous contrast. CONTRAST:  80m GADAVIST GADOBUTROL 1 MMOL/ML IV SOLN COMPARISON:  No pertinent prior exams available for comparison. FINDINGS: Brain: Cerebral volume is normal. Multifocal T2 FLAIR hyperintense signal abnormality within the bilateral subcortical cerebral white matter, mild but greater than expected for age. 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 The right cerebellar tonsil extends 5 mm below the level foramen magnum. 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 No evidence of an intracranial mass. Specifically, no cerebellopontine angle or internal auditory canal mass is demonstrated. Unremarkable appearance of the 7th and 8th cranial nerves bilaterally. There is no acute infarct. No chronic intracranial blood products. No extra-axial fluid collection. No midline shift. No pathologic intracranial enhancement identified. Vascular: Maintained flow voids within the proximal large arterial vessels. Skull and upper cervical spine: No focal suspicious marrow lesion. Sinuses/Orbits: No mass or acute finding within the imaged orbits. Postsurgical appearance of the paranasal sinuses. Small mucous retention cysts within the bilateral maxillary  sinuses. IMPRESSION: 1.  No evidence of acute intracranial abnormality. 2. No cerebellopontine angle or internal auditory canal mass. 3. Multifocal T2 FLAIR hyperintense signal abnormality within the bilateral subcortical cerebral white matter, mild but greater than expected for age. These signal changes are nonspecific and differential considerations include chronic small vessel ischemic disease, sequelae of chronic migraine headaches or sequelae of a prior infectious/inflammatory process, among others. 4. 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 The right cerebellar tonsil extends 5 mm below the foramen magnum. 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 5. Small mucous retention cysts within the bilateral maxillary sinuses. Electronically Signed   By: KKellie SimmeringD.O.   On: 08/21/2022 13:10

## 2022-09-02 NOTE — Assessment & Plan Note (Signed)
Recommend genetic testing. She declines.

## 2022-09-02 NOTE — Assessment & Plan Note (Signed)
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 Check CBC, smear, multiple myeloma panel, light chain ratio, flow cytometry, LDH, HIV, hepatitis, ANA, CRP, ESR.

## 2022-09-02 NOTE — Assessment & Plan Note (Signed)
TSH was normal. Check iron, TIBC ferritin, vitamin D level,

## 2022-09-02 NOTE — Assessment & Plan Note (Signed)
Pending above workup.

## 2022-09-03 LAB — ANA W/REFLEX: Anti Nuclear Antibody (ANA): NEGATIVE

## 2022-09-03 LAB — KAPPA/LAMBDA LIGHT CHAINS
Kappa free light chain: 15.5 mg/L (ref 3.3–19.4)
Kappa, lambda light chain ratio: 1.05 (ref 0.26–1.65)
Lambda free light chains: 14.7 mg/L (ref 5.7–26.3)

## 2022-09-03 LAB — MISC LABCORP TEST (SEND OUT): Labcorp test code: 81950

## 2022-09-05 LAB — COMP PANEL: LEUKEMIA/LYMPHOMA

## 2022-09-08 LAB — MULTIPLE MYELOMA PANEL, SERUM
Albumin SerPl Elph-Mcnc: 3.8 g/dL (ref 2.9–4.4)
Albumin/Glob SerPl: 1.3 (ref 0.7–1.7)
Alpha 1: 0.3 g/dL (ref 0.0–0.4)
Alpha2 Glob SerPl Elph-Mcnc: 0.9 g/dL (ref 0.4–1.0)
B-Globulin SerPl Elph-Mcnc: 1.1 g/dL (ref 0.7–1.3)
Gamma Glob SerPl Elph-Mcnc: 0.8 g/dL (ref 0.4–1.8)
Globulin, Total: 3 g/dL (ref 2.2–3.9)
IgA: 155 mg/dL (ref 87–352)
IgG (Immunoglobin G), Serum: 893 mg/dL (ref 586–1602)
IgM (Immunoglobulin M), Srm: 89 mg/dL (ref 26–217)
Total Protein ELP: 6.8 g/dL (ref 6.0–8.5)

## 2022-10-01 ENCOUNTER — Encounter: Payer: Self-pay | Admitting: Oncology

## 2022-10-01 ENCOUNTER — Inpatient Hospital Stay: Payer: BC Managed Care – PPO | Attending: Oncology | Admitting: Oncology

## 2022-10-01 VITALS — BP 127/84 | HR 78 | Temp 98.1°F | Resp 18 | Wt 246.6 lb

## 2022-10-01 DIAGNOSIS — I10 Essential (primary) hypertension: Secondary | ICD-10-CM | POA: Insufficient documentation

## 2022-10-01 DIAGNOSIS — E119 Type 2 diabetes mellitus without complications: Secondary | ICD-10-CM | POA: Diagnosis not present

## 2022-10-01 DIAGNOSIS — Z809 Family history of malignant neoplasm, unspecified: Secondary | ICD-10-CM

## 2022-10-01 DIAGNOSIS — D72829 Elevated white blood cell count, unspecified: Secondary | ICD-10-CM | POA: Diagnosis not present

## 2022-10-01 DIAGNOSIS — E559 Vitamin D deficiency, unspecified: Secondary | ICD-10-CM | POA: Diagnosis not present

## 2022-10-01 DIAGNOSIS — Z79899 Other long term (current) drug therapy: Secondary | ICD-10-CM | POA: Insufficient documentation

## 2022-10-01 DIAGNOSIS — Z87891 Personal history of nicotine dependence: Secondary | ICD-10-CM | POA: Insufficient documentation

## 2022-10-01 NOTE — Assessment & Plan Note (Addendum)
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 CBC showed no leukocytosis, no M protein on multiple myeloma panel, normal light chain ratio,negative peripheral blood flow cytometry, normal LDH,negative  HIV,negative  hepatitis, negative ANA, elevated CRP 2.4, elevated  ESR 22, indicating chronic inflammation.  I will hold off additional work up for now. Recommend patient to further discuss with primary care provider regarding elevated ESR and CRP.

## 2022-10-01 NOTE — Assessment & Plan Note (Signed)
Recommend Vitamin D 1000 units daily supplementation and follow up with pcp

## 2022-10-01 NOTE — Assessment & Plan Note (Signed)
Recommend genetic testing.  She declines.  

## 2022-10-01 NOTE — Progress Notes (Signed)
Hematology/Oncology Consult note [phone] [phone]   CHIEF COMPLAINTS/REASON FOR VISIT:  Follow up for leukocytosis   ASSESSMENT & PLAN:   Leukocytosis Chronic leukocytosis, likely reactive. CBC showed no leukocytosis, no M protein on multiple myeloma panel, normal light chain ratio,negative peripheral blood flow cytometry, normal LDH,negative  HIV,negative  hepatitis, negative ANA, elevated CRP 2.4, elevated  ESR 22, indicating chronic inflammation.  I will hold off additional work up for now. Recommend patient to further discuss with primary care provider regarding elevated ESR and CRP.   Family history of cancer Recommend genetic testing. She declines.   Vitamin D deficiency Recommend Vitamin D 1000 units daily supplementation and follow up with pcp  Patient is discharged from my clinic. I recommend patient to continue follow up with primary care physician. Patient may re-establish care in the future if clinically indicated.  Earlie Server, MD, PhD [facility] Hematology Oncology 10/01/2022   HISTORY OF PRESENTING ILLNESS:   Amanda English is a  47 y.o.  female with PMH listed below was seen in consultation at the request of  Gladstone Lighter, MD  for evaluation of leukocytosis Reviewed patient's previous blood work. Patient has a history of  leukocytosis since 2016.  Predominantly neutrophilia.  Patient reports having COVID 19 infection in December 2023 and since then she has felt very tired, lack of energy.  She describes the fatigue level as severe as when she needed blood transfusion in the past.   Denies any intentional weight loss, fever.  She endorses mild night sweats 3-4 times per week, which has been a chronic issue for her and has improved comparing to her symptoms right after complete hysterectomy..  Occasional hot flash.  Patient has a history of juvenile rheumatoid arthritis for which she used to see rheumatology.  She has not needed to see  rheumatology for a long time.  She has some joint discomfort but overall manageable. Denies any chronic steroid use She is a former smoker, quit in 2018.  INTERVAL HISTORY Amanda English is a 47 y.o. female who has above history reviewed by me today presents for follow up visit for leukocytosis.  She presents to discuss blood work.   MEDICAL HISTORY:  Past Medical History:  Diagnosis Date   Anxiety    Arthritis    Chiari malformation    Complication of anesthesia    woke during one surgery   Depression    Diabetes mellitus without complication ([place])    Diabetes mellitus, type II ([place])    Family history of adverse reaction to anesthesia    Father - woke during one surgery   Fatty liver    GERD (gastroesophageal reflux disease)    High cholesterol    Hypertension    Migraine headache    approx 2x/month   Mitral valve regurgitation    Spina bifida occulta     SURGICAL HISTORY: Past Surgical History:  Procedure Laterality Date   ABDOMINAL HYSTERECTOMY     APPENDECTOMY     CESAREAN SECTION     COLONOSCOPY WITH PROPOFOL N/A 10/04/2018   Procedure: COLONOSCOPY WITH PROPOFOL;  Surgeon: Lin Landsman, MD;  Location: [facility];  Service: Gastroenterology;  Laterality: N/A;   ETHMOIDECTOMY Bilateral 11/17/2019   Procedure: TOTAL ETHMOIDECTOMY WITH FRONTAL SINUOTOMY;  Surgeon: Margaretha Sheffield, MD;  Location: [place];  Service: ENT;  Laterality: Bilateral;   IMAGE GUIDED SINUS SURGERY Bilateral 11/17/2019   Procedure: IMAGE GUIDED SINUS SURGERY;  Surgeon: Margaretha Sheffield, MD;  Location: [facility];  Service: ENT;  Laterality: Bilateral;  need stryker disk GAVE DISK TO BRENDA 4-23  KP   MAXILLARY ANTROSTOMY Bilateral 11/17/2019   Procedure: MAXILLARY ANTROSTOMY;  Surgeon: Margaretha Sheffield, MD;  Location: [place];  Service: ENT;  Laterality: Bilateral;   NOSE SURGERY     STERNAL WIRE REMOVAL     thromectomy     thymus removal     TURBINATE  REDUCTION Bilateral 11/17/2019   Procedure: TURBINATE REDUCTION;  Surgeon: Margaretha Sheffield, MD;  Location: [place];  Service: ENT;  Laterality: Bilateral;  Diabetic - oral meds    SOCIAL HISTORY: Social History   Socioeconomic History   Marital status: Married    Spouse name: Not on file   Number of children: Not on file   Years of education: Not on file   Highest education level: Not on file  Occupational History   Not on file  Tobacco Use   Smoking status: Former    Packs/day: 0.50    Years: 12.00    Total pack years: 6.00    Types: Cigarettes    Quit date: 2018    Years since quitting: 6.2   Smokeless tobacco: Never  Vaping Use   Vaping Use: Former   Quit date: 09/02/2016   Substances: Nicotine, Flavoring, Nicotine-salt   Devices: SMOK  Substance and Sexual Activity   Alcohol use: No   Drug use: No   Sexual activity: Yes  Other Topics Concern   Not on file  Social History Narrative   Not on file   Social Determinants of Health   Financial Resource Strain: Not on file  Food Insecurity: No Food Insecurity (09/02/2022)   Hunger Vital Sign    Worried About Running Out of Food in the Last Year: Never true    Ran Out of Food in the Last Year: Never true  Transportation Needs: No Transportation Needs (09/02/2022)   PRAPARE - Hydrologist (Medical): No    Lack of Transportation (Non-Medical): No  Physical Activity: Not on file  Stress: Not on file  Social Connections: Not on file  Intimate Partner Violence: Not At Risk (09/02/2022)   Humiliation, Afraid, Rape, and Kick questionnaire    Fear of Current or Ex-Partner: No    Emotionally Abused: No    Physically Abused: No    Sexually Abused: No    FAMILY HISTORY: Family History  Problem Relation Age of Onset   Depression Mother    Bipolar disorder Mother    Alzheimer's disease Father    Thyroid disease Brother    Breast cancer Maternal Aunt 45   Thyroid cancer Maternal  Grandmother    Leukemia Paternal Grandmother     ALLERGIES:  is allergic to macrobid [nitrofurantoin], ibuprofen, and nsaids.  MEDICATIONS:  Current Outpatient Medications  Medication Sig Dispense Refill   cloNIDine (CATAPRES) 0.1 MG tablet Take by mouth.     cyanocobalamin (VITAMIN B12) 1000 MCG/ML injection Inject 1,000 mcg into the muscle every 30 (thirty) days.     diazepam (VALIUM) 5 MG tablet Take by mouth.     escitalopram (LEXAPRO) 20 MG tablet Take 20 mg by mouth daily.     hydrALAZINE (APRESOLINE) 25 MG tablet Take 1 tablet by mouth 3 (three) times daily.     hydrochlorothiazide (HYDRODIURIL) 25 MG tablet Take 1 tablet by mouth daily.     ketorolac (TORADOL) 10 MG tablet as needed.   0   Lancets [facility]  PLUS LANCET33G) MISC USE TO CHECK SUGARS ONCE DAILY DX E11.9     ondansetron (ZOFRAN) 4 MG tablet Take 4 mg by mouth every 8 (eight) hours as needed for nausea or vomiting.     ONETOUCH VERIO test strip USE TO CHECK SUGARS DAILY     potassium chloride (KLOR-CON) 10 MEQ tablet Take by mouth.     Semaglutide,0.25 or 0.'5MG'$ /DOS, 2 MG/3ML SOPN Inject into the skin. Inject 0.75 mLs (0.5 mg total) subcutaneously once a week     traZODone (DESYREL) 100 MG tablet trazodone 100 mg tablet     Triamcinolone Acetonide (NASACORT AQ NA) Place into the nose daily as needed.     ALPRAZolam (XANAX) 0.5 MG tablet Take 0.5 mg by mouth every 6 (six) hours as needed. (Patient not taking: Reported on 10/01/2022)     amLODipine (NORVASC) 2.5 MG tablet Take 2.5 mg by mouth daily. (Patient not taking: Reported on 10/01/2022)     Current Facility-Administered Medications  Medication Dose Route Frequency Provider Last Rate Last Admin   triamcinolone acetonide (KENALOG) 10 MG/ML injection 10 mg  10 mg Other Once Trula Slade, DPM       triamcinolone acetonide (KENALOG) 10 MG/ML injection 10 mg  10 mg Other Once Trula Slade, DPM        Review of Systems  Constitutional:  Positive for  fatigue. Negative for appetite change, chills and fever.  HENT:   Negative for hearing loss and voice change.   Eyes:  Negative for eye problems.  Respiratory:  Negative for chest tightness and cough.   Cardiovascular:  Negative for chest pain.  Gastrointestinal:  Negative for abdominal distention, abdominal pain and blood in stool.  Endocrine: Negative for hot flashes.  Genitourinary:  Negative for difficulty urinating and frequency.   Musculoskeletal:  Negative for arthralgias.  Skin:  Negative for itching and rash.  Neurological:  Negative for extremity weakness.  Hematological:  Negative for adenopathy.  Psychiatric/Behavioral:  Negative for confusion.    PHYSICAL EXAMINATION: ECOG PERFORMANCE STATUS: 1 - Symptomatic but completely ambulatory Vitals:   10/01/22 1350  BP: 127/84  Pulse: 78  Resp: 18  Temp: 98.1 F (36.7 C)  SpO2: 100%   Filed Weights   10/01/22 1350  Weight: 246 lb 9.6 oz (111.9 kg)    Physical Exam Constitutional:      General: She is not in acute distress.    Appearance: She is obese.  HENT:     Head: Normocephalic and atraumatic.  Eyes:     General: No scleral icterus. Cardiovascular:     Rate and Rhythm: Normal rate and regular rhythm.     Heart sounds: Normal heart sounds.  Pulmonary:     Effort: Pulmonary effort is normal. No respiratory distress.     Breath sounds: No wheezing.  Abdominal:     General: Bowel sounds are normal. There is no distension.     Palpations: Abdomen is soft.  Musculoskeletal:        General: No deformity. Normal range of motion.     Cervical back: Normal range of motion and neck supple.  Skin:    General: Skin is warm and dry.     Findings: No erythema or rash.  Neurological:     Mental Status: She is alert and oriented to person, place, and time. Mental status is at baseline.     Cranial Nerves: No cranial nerve deficit.     Coordination: Coordination normal.  Psychiatric:  Mood and Affect: Mood  normal.     LABORATORY DATA:  I have reviewed the data as listed    Latest Ref Rng & Units 09/02/2022   10:12 AM 01/25/2021    6:24 PM 08/27/2020   10:05 PM  CBC  WBC 4.0 - 10.5 K/uL 9.7  13.6  11.0   Hemoglobin 12.0 - 15.0 g/dL 12.1  11.9  11.6   Hematocrit 36.0 - 46.0 % 38.1  35.8  36.6   Platelets 150 - 400 K/uL 321  319  317       Latest Ref Rng & Units 09/02/2022   10:12 AM 01/25/2021    6:24 PM 08/27/2020   10:05 PM  CMP  Glucose 70 - 99 mg/dL 104  103  89   BUN 6 - 20 mg/dL '11  8  9   '$ Creatinine 0.44 - 1.00 mg/dL 0.64  0.83  0.72   Sodium 135 - 145 mmol/L 139  132  136   Potassium 3.5 - 5.1 mmol/L 3.5  3.8  3.9   Chloride 98 - 111 mmol/L 104  99  99   CO2 22 - 32 mmol/L '27  23  25   '$ Calcium 8.9 - 10.3 mg/dL 9.5  9.3  9.3   Total Protein 6.5 - 8.1 g/dL 7.8   7.1   Total Bilirubin 0.3 - 1.2 mg/dL 0.2   0.9   Alkaline Phos 38 - 126 U/L 96   84   AST 15 - 41 U/L 18   27   ALT 0 - 44 U/L 20   33       RADIOGRAPHIC STUDIES: I have personally reviewed the radiological images as listed and agreed with the findings in the report. MR BRAIN W WO CONTRAST  Result Date: 08/21/2022 CLINICAL DATA:  Provided history: Acute intractable headache, unspecified headache type. Dizziness and giddiness. Additional history provided by the scanning technologist: The patient reports headaches and dizziness since suffering a fall, ringing in both ears, blurry vision. EXAM: MRI HEAD WITHOUT AND WITH CONTRAST TECHNIQUE: Multiplanar, multiecho pulse sequences of the brain and surrounding structures were obtained without and with intravenous contrast. CONTRAST:  76m GADAVIST GADOBUTROL 1 MMOL/ML IV SOLN COMPARISON:  No pertinent prior exams available for comparison. FINDINGS: Brain: Cerebral volume is normal. Multifocal T2 FLAIR hyperintense signal abnormality within the bilateral subcortical cerebral white matter, mild but greater than expected for age. 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 The right cerebellar tonsil extends 5 mm below the level foramen magnum. 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 No evidence of an intracranial mass. Specifically, no cerebellopontine angle or internal auditory canal mass is demonstrated. Unremarkable appearance of the 7th and 8th cranial nerves bilaterally. There is no acute infarct. No chronic intracranial blood products. No extra-axial fluid collection. No midline shift. No pathologic intracranial enhancement identified. Vascular: Maintained flow voids within the proximal large arterial vessels. Skull and upper cervical spine: No focal suspicious marrow lesion. Sinuses/Orbits: No mass or acute finding within the imaged orbits. Postsurgical appearance of the paranasal sinuses. Small mucous retention cysts within the bilateral maxillary sinuses. IMPRESSION: 1.  No evidence of acute intracranial abnormality. 2. No cerebellopontine angle or internal auditory canal mass. 3. Multifocal T2 FLAIR hyperintense signal abnormality within the bilateral subcortical cerebral white matter, mild but greater than expected for age. These signal changes are nonspecific and differential considerations include chronic small vessel ischemic disease, sequelae of chronic migraine headaches or sequelae of a prior  infectious/inflammatory process, among others. 4. 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 The right cerebellar tonsil extends 5 mm below the foramen magnum. 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 5. Small mucous retention cysts within the bilateral maxillary sinuses. Electronically Signed   By: Kellie Simmering D.O.   On: 08/21/2022 13:10

## 2022-10-08 ENCOUNTER — Other Ambulatory Visit: Payer: Self-pay | Admitting: Internal Medicine

## 2022-10-08 DIAGNOSIS — I2089 Other forms of angina pectoris: Secondary | ICD-10-CM

## 2022-10-08 DIAGNOSIS — I10 Essential (primary) hypertension: Secondary | ICD-10-CM

## 2022-10-15 ENCOUNTER — Ambulatory Visit: Payer: BC Managed Care – PPO | Admitting: Dermatology

## 2022-10-15 ENCOUNTER — Encounter: Payer: Self-pay | Admitting: Dermatology

## 2022-10-15 VITALS — BP 108/72 | HR 75

## 2022-10-15 DIAGNOSIS — L309 Dermatitis, unspecified: Secondary | ICD-10-CM | POA: Diagnosis not present

## 2022-10-15 DIAGNOSIS — L820 Inflamed seborrheic keratosis: Secondary | ICD-10-CM

## 2022-10-15 DIAGNOSIS — D2239 Melanocytic nevi of other parts of face: Secondary | ICD-10-CM

## 2022-10-15 DIAGNOSIS — L988 Other specified disorders of the skin and subcutaneous tissue: Secondary | ICD-10-CM

## 2022-10-15 DIAGNOSIS — Z7189 Other specified counseling: Secondary | ICD-10-CM | POA: Diagnosis not present

## 2022-10-15 DIAGNOSIS — L719 Rosacea, unspecified: Secondary | ICD-10-CM

## 2022-10-15 DIAGNOSIS — L209 Atopic dermatitis, unspecified: Secondary | ICD-10-CM

## 2022-10-15 MED ORDER — RHOFADE 1 % EX CREA: 1.0000 | TOPICAL_CREAM | Freq: Every morning | CUTANEOUS | 4 refills | Status: DC

## 2022-10-15 NOTE — Patient Instructions (Signed)
Cryotherapy Aftercare  Wash gently with soap and water everyday.   Apply Vaseline and Band-Aid daily until healed.     Due to recent changes in healthcare laws, you may see results of your pathology and/or laboratory studies on MyChart before the doctors have had a chance to review them. We understand that in some cases there may be results that are confusing or concerning to you. Please understand that not all results are received at the same time and often the doctors may need to interpret multiple results in order to provide you with the best plan of care or course of treatment. Therefore, we ask that you please give us 2 business days to thoroughly review all your results before contacting the office for clarification. Should we see a critical lab result, you will be contacted sooner.   If You Need Anything After Your Visit  If you have any questions or concerns for your doctor, please call our main line at [phone] and press option 4 to reach your doctor's medical assistant. If no one answers, please leave a voicemail as directed and we will return your call as soon as possible. Messages left after 4 pm will be answered the following business day.   You may also send us a message via MyChart. We typically respond to MyChart messages within 1-2 business days.  For prescription refills, please ask your pharmacy to contact our office. Our fax number is [phone].  If you have an urgent issue when the clinic is closed that cannot wait until the next business day, you can page your doctor at the number below.    Please note that while we do our best to be available for urgent issues outside of office hours, we are not available 24/7.   If you have an urgent issue and are unable to reach us, you may choose to seek medical care at your doctor's office, retail clinic, urgent care center, or emergency room.  If you have a medical emergency, please immediately call 911 or go to the  emergency department.  Pager Numbers  - Dr. Kowalski: [phone]  - Dr. Moye: [phone]  - Dr. Stewart: [phone]  In the event of inclement weather, please call our main line at [phone] for an update on the status of any delays or closures.  Dermatology Medication Tips: Please keep the boxes that topical medications come in in order to help keep track of the instructions about where and how to use these. Pharmacies typically print the medication instructions only on the boxes and not directly on the medication tubes.   If your medication is too expensive, please contact our office at [phone] option 4 or send us a message through MyChart.   We are unable to tell what your co-pay for medications will be in advance as this is different depending on your insurance coverage. However, we may be able to find a substitute medication at lower cost or fill out paperwork to get insurance to cover a needed medication.   If a prior authorization is required to get your medication covered by your insurance company, please allow us 1-2 business days to complete this process.  Drug prices often vary depending on where the prescription is filled and some pharmacies may offer cheaper prices.  The website www.goodrx.com contains coupons for medications through different pharmacies. The prices here do not account for what the cost may be with help from insurance (it may be cheaper with your insurance), but the website can   give you the price if you did not use any insurance.  - You can print the associated coupon and take it with your prescription to the pharmacy.  - You may also stop by our office during regular business hours and pick up a GoodRx coupon card.  - If you need your prescription sent electronically to a different pharmacy, notify our office through [place] MyChart or by phone at [phone] option 4.     Si Usted Necesita Algo Despus de Su Visita  Tambin puede  enviarnos un mensaje a travs de MyChart. Por lo general respondemos a los mensajes de MyChart en el transcurso de 1 a 2 das hbiles.  Para renovar recetas, por favor pida a su farmacia que se ponga en contacto con nuestra oficina. Nuestro nmero de fax es el [phone].  Si tiene un asunto urgente cuando la clnica est cerrada y que no puede esperar hasta el siguiente da hbil, puede llamar/localizar a su doctor(a) al nmero que aparece a continuacin.   Por favor, tenga en cuenta que aunque hacemos todo lo posible para estar disponibles para asuntos urgentes fuera del horario de oficina, no estamos disponibles las 24 horas del da, los 7 das de la semana.   Si tiene un problema urgente y no puede comunicarse con nosotros, puede optar por buscar atencin mdica  en el consultorio de su doctor(a), en una clnica privada, en un centro de atencin urgente o en una sala de emergencias.  Si tiene una emergencia mdica, por favor llame inmediatamente al 911 o vaya a la sala de emergencias.  Nmeros de bper  - Dr. Kowalski: [phone]  - Dra. Moye: [phone]  - Dra. Stewart: [phone]  En caso de inclemencias del tiempo, por favor llame a nuestra lnea principal al [phone] para una actualizacin sobre el estado de cualquier retraso o cierre.  Consejos para la medicacin en dermatologa: Por favor, guarde las cajas en las que vienen los medicamentos de uso tpico para ayudarle a seguir las instrucciones sobre dnde y cmo usarlos. Las farmacias generalmente imprimen las instrucciones del medicamento slo en las cajas y no directamente en los tubos del medicamento.   Si su medicamento es muy caro, por favor, pngase en contacto con nuestra oficina llamando al [phone] y presione la opcin 4 o envenos un mensaje a travs de MyChart.   No podemos decirle cul ser su copago por los medicamentos por adelantado ya que esto es diferente dependiendo de la cobertura de su seguro.  Sin embargo, es posible que podamos encontrar un medicamento sustituto a menor costo o llenar un formulario para que el seguro cubra el medicamento que se considera necesario.   Si se requiere una autorizacin previa para que su compaa de seguros cubra su medicamento, por favor permtanos de 1 a 2 das hbiles para completar este proceso.  Los precios de los medicamentos varan con frecuencia dependiendo del lugar de dnde se surte la receta y alguna farmacias pueden ofrecer precios ms baratos.  El sitio web www.goodrx.com tiene cupones para medicamentos de diferentes farmacias. Los precios aqu no tienen en cuenta lo que podra costar con la ayuda del seguro (puede ser ms barato con su seguro), pero el sitio web puede darle el precio si no utiliz ningn seguro.  - Puede imprimir el cupn correspondiente y llevarlo con su receta a la farmacia.  - Tambin puede pasar por nuestra oficina durante el horario de atencin regular y recoger una tarjeta de cupones de GoodRx.  -   Si necesita que su receta se enve electrnicamente a una farmacia diferente, informe a nuestra oficina a travs de MyChart de [place] o por telfono llamando al [phone] y presione la opcin 4.  

## 2022-10-15 NOTE — Progress Notes (Signed)
New Patient Visit   Subjective  Amanda English is a 47 y.o. female who presents for the following: Rash started 07/23, abdomen, was itchy and painful, now just itchy, tried steroid creams, systemic steroid injection, both made worse, check spots face, and redness on face would like to try cream to help with redness, facial elastosis face, interested in Botox  New patient referral from Dr. Gladstone Lighter.  The following portions of the chart were reviewed this encounter and updated as appropriate: medications, allergies, medical history  Review of Systems:  No other skin or systemic complaints except as noted in HPI or Assessment and Plan.  Objective  Well appearing patient in no apparent distress; mood and affect are within normal limits.  A focused examination was performed of the following areas: Face, abdomen  Relevant exam findings are noted in the Assessment and Plan.   Assessment & Plan    ALLERGIC CONTACT DERMATITIS vs Eczema/Atopic Dermatitis Lower abdomen Exam: lower abdomen clear today other then xerosis, photos from phone showed shiny erythema with some patches of crusting  Treatment Plan: Cont Cerave healing ointment  Discussed True Patch Test 36 Discussed f/u and re-evaluate when flared may consider bx  ROSACEA face Exam Erythema and telangiectasias   Rosacea is a chronic progressive skin condition usually affecting the face of adults, causing redness and/or acne bumps. It is treatable but not curable. It sometimes affects the eyes (ocular rosacea) as well. It may respond to topical and/or systemic medication and can flare with stress, sun exposure, alcohol, exercise, topical steroids (including hydrocortisone/cortisone 10) and some foods.  Daily application of broad spectrum spf 30+ sunscreen to face is recommended to reduce flares.  Treatment Plan Counseling for BBL / IPL / Laser and Coordination of Care Discussed the treatment option of Broad Band  Light (BBL) /Intense Pulsed Light (IPL)/ Laser for skin discoloration, including brown spots and redness.  Typically we recommend at least 1-3 treatment sessions about 5-8 weeks apart for best results.  Cannot have tanned skin when BBL performed, and regular use of sunscreen is advised after the procedure to help maintain results. The patient's condition may also require "maintenance treatments" in the future.  The fee for BBL / laser treatments is $350 per treatment session for the whole face.  A fee can be quoted for other parts of the body.  Insurance typically does not pay for BBL/laser treatments and therefore the fee is an out-of-pocket cost.   Rhofade qam to face  INFLAMED SEBORRHEIC KERATOSIS Exam: Erythematous keratotic or waxy stuck-on papule or plaque.  Symptomatic, irritating, patient would like treated.  Benign-appearing.  Call clinic for new or changing lesions.   Prior to procedure, discussed risks of blister formation, small wound, skin dyspigmentation, or rare scar following treatment. Recommend Vaseline ointment to treated areas while healing.  Destruction Procedure Note Destruction method: cryotherapy   Informed consent: discussed and consent obtained   Lesion destroyed using liquid nitrogen: Yes   Outcome: patient tolerated procedure well with no complications   Post-procedure details: wound care instructions given   Locations: L temple x 1 # of Lesions Treated: 1  MELANOCYTIC NEVUS VS DEEP COMEDONE R nasal bridge Exam: Dark 1.95mm macule  Treatment: Benign-appearing.  Observation.  Call clinic for new or changing moles.  Recommend daily use of broad spectrum spf 30+ sunscreen to sun-exposed areas.      FACIAL ELASTOSIS Exam: Rhytides and volume loss.  Treatment Plan: Discussed Botox 25 units frown complex, discussed Botox 4  units to fine lines upper lip  Recommend daily broad spectrum sunscreen SPF 30+ to sun-exposed areas, reapply every 2 hours as needed.  Call for new or changing lesions.  Staying in the shade or wearing long sleeves, sun glasses (UVA+UVB protection) and wide brim hats (4-inch brim around the entire circumference of the hat) are also recommended for sun protection.    Return for schedule for Botox.  I, Amanda English, RMA, am acting as scribe for Amanda Ser, MD .  Documentation: I have reviewed the above documentation for accuracy and completeness, and I agree with the above.  Amanda Ser, MD

## 2022-10-17 ENCOUNTER — Ambulatory Visit
Admission: RE | Admit: 2022-10-17 | Discharge: 2022-10-17 | Disposition: A | Discharge status: Home / Self Care | Payer: BC Managed Care – PPO | Source: Ambulatory Visit | Attending: Internal Medicine | Admitting: Internal Medicine

## 2022-10-17 DIAGNOSIS — I10 Essential (primary) hypertension: Secondary | ICD-10-CM | POA: Insufficient documentation

## 2022-10-17 DIAGNOSIS — I2089 Other forms of angina pectoris: Secondary | ICD-10-CM | POA: Insufficient documentation

## 2022-10-29 ENCOUNTER — Telehealth: Payer: Self-pay | Admitting: Cardiovascular Disease

## 2022-10-29 NOTE — Telephone Encounter (Signed)
Pt said that she wants to continue care at [facility] for cardiology. Pt's upcoming appointment with Mariah Milling has been canceled.

## 2022-10-29 NOTE — Telephone Encounter (Signed)
-----   Message from Marina C Lopez, CMA sent at 10/29/2022  9:41 AM EDT ----- Pt has f/u 11/04/2022 with [facility]. Pt recently had visit with [facility] with Dr. Callwood. Please contact pt and see if pt would like to keep her care with [facility] or [facility].   

## 2022-10-29 NOTE — Telephone Encounter (Signed)
Pt has f/u 11/04/2022 with [facility]. Pt recently had visit with [facility] cardiology with Dr. Juliann Pares. Please inquire if pt would like to keep her appointment with [facility] on 4/16 with [facility] or if she wants to continue with [facility] cardiology.

## 2022-10-29 NOTE — Telephone Encounter (Signed)
-----   Message from Kendrick Fries, [place] sent at 10/29/2022  9:41 AM EDT ----- Pt has f/u 11/04/2022 with [facility]. Pt recently had visit with [facility] cardiology with Dr. Juliann Pares. Please contact pt and see if pt would like to keep her care with [facility] or [place].

## 2022-11-04 ENCOUNTER — Ambulatory Visit: Payer: BC Managed Care – PPO | Admitting: Cardiovascular Disease

## 2022-12-05 ENCOUNTER — Other Ambulatory Visit: Payer: Self-pay | Admitting: Physician Assistant

## 2022-12-05 ENCOUNTER — Ambulatory Visit
Admission: RE | Admit: 2022-12-05 | Discharge: 2022-12-05 | Disposition: A | Discharge status: Home / Self Care | Payer: BC Managed Care – PPO | Source: Ambulatory Visit | Attending: Physician Assistant | Admitting: Physician Assistant

## 2022-12-05 DIAGNOSIS — R1032 Left lower quadrant pain: Secondary | ICD-10-CM

## 2022-12-05 MED ORDER — IOPAMIDOL (ISOVUE-300) INJECTION 61%
100.0000 mL | Freq: Once | INTRAVENOUS | Status: AC | PRN
  Administered 2022-12-05: 100 mL via INTRAVENOUS

## 2022-12-09 ENCOUNTER — Other Ambulatory Visit: Payer: Self-pay | Admitting: Physician Assistant

## 2022-12-09 DIAGNOSIS — R1032 Left lower quadrant pain: Secondary | ICD-10-CM

## 2022-12-17 ENCOUNTER — Other Ambulatory Visit: Payer: Self-pay

## 2022-12-17 DIAGNOSIS — Z1231 Encounter for screening mammogram for malignant neoplasm of breast: Secondary | ICD-10-CM

## 2022-12-30 ENCOUNTER — Telehealth: Payer: Self-pay | Admitting: Oncology

## 2022-12-30 NOTE — Telephone Encounter (Signed)
Patient was Amanda English according to LOS 09/2022. She called today to report that Dr. Nemiah Commander would like for her to f/u due to continued lab irregularities and that she would be sending a direct message to Dr. Cathie Hoops.  Please advise on scheduling.

## 2023-01-06 ENCOUNTER — Ambulatory Visit: Payer: BC Managed Care – PPO | Admitting: Dermatology

## 2023-01-12 ENCOUNTER — Inpatient Hospital Stay: Payer: BC Managed Care – PPO | Attending: Oncology | Admitting: Oncology

## 2023-01-12 ENCOUNTER — Encounter: Payer: Self-pay | Admitting: Oncology

## 2023-01-12 ENCOUNTER — Inpatient Hospital Stay: Payer: BC Managed Care – PPO

## 2023-01-12 VITALS — BP 122/66 | HR 64 | Temp 98.3°F | Resp 18 | Ht 63.0 in | Wt 248.9 lb

## 2023-01-12 DIAGNOSIS — Z8739 Personal history of other diseases of the musculoskeletal system and connective tissue: Secondary | ICD-10-CM

## 2023-01-12 DIAGNOSIS — Z809 Family history of malignant neoplasm, unspecified: Secondary | ICD-10-CM

## 2023-01-12 DIAGNOSIS — R7402 Elevation of levels of lactic acid dehydrogenase (LDH): Secondary | ICD-10-CM

## 2023-01-12 DIAGNOSIS — R5383 Other fatigue: Secondary | ICD-10-CM | POA: Diagnosis not present

## 2023-01-12 DIAGNOSIS — Z87891 Personal history of nicotine dependence: Secondary | ICD-10-CM | POA: Diagnosis not present

## 2023-01-12 DIAGNOSIS — D72829 Elevated white blood cell count, unspecified: Secondary | ICD-10-CM

## 2023-01-12 DIAGNOSIS — Z79899 Other long term (current) drug therapy: Secondary | ICD-10-CM | POA: Diagnosis not present

## 2023-01-12 DIAGNOSIS — E559 Vitamin D deficiency, unspecified: Secondary | ICD-10-CM | POA: Diagnosis not present

## 2023-01-12 DIAGNOSIS — D72810 Lymphocytopenia: Secondary | ICD-10-CM

## 2023-01-12 LAB — VITAMIN B12: Vitamin B-12: 514 pg/mL (ref 180–914)

## 2023-01-12 LAB — FOLATE: Folate: 13.7 ng/mL (ref >5.9)

## 2023-01-12 MED ORDER — VITRON-C 65-125 MG PO TABS: 1.0000 | ORAL_TABLET | Freq: Every day | ORAL | 3 refills | Status: AC

## 2023-01-12 NOTE — Progress Notes (Signed)
Patient has a list of concerns for the provider to look at.

## 2023-01-14 LAB — CMV DNA, QUANTITATIVE, PCR
CMV DNA Quant: NEGATIVE IU/mL
Log10 CMV Qn DNA Pl: UNDETERMINED log10 IU/mL

## 2023-01-14 LAB — EPSTEIN BARR VRS(EBV DNA BY PCR): EBV DNA QN by PCR: NEGATIVE IU/mL

## 2023-01-31 NOTE — Assessment & Plan Note (Signed)
Recommend Vitamin D 1000 units daily supplementation and follow up with pcp 

## 2023-01-31 NOTE — Assessment & Plan Note (Signed)
Recommend genetic testing.  She declines.  

## 2023-01-31 NOTE — Progress Notes (Signed)
Hematology/Oncology Consult note [phone] [phone]   CHIEF COMPLAINTS/REASON FOR VISIT:  Follow up for leukocytosis   ASSESSMENT & PLAN:   Leukocytosis Chronic leukocytosis, likely reactive. CBC showed no leukocytosis, no M protein on multiple myeloma panel, normal light chain ratio,negative peripheral blood flow cytometry, normal LDH,negative  HIV,negative  hepatitis, negative ANA, elevated CRP 2.4, elevated  ESR 22, indicating chronic inflammation. Negative EBV and CMV I will hold off additional work up for now. Recommend patient to further discuss with primary care provider regarding rheumatology evaluation.   Vitamin D deficiency Recommend Vitamin D 1000 units daily supplementation and follow up with pcp  Family history of cancer Recommend genetic testing. She declines.    Rickard Patience, MD, PhD [facility] Hematology Oncology 01/12/2023   HISTORY OF PRESENTING ILLNESS:   Amanda English is a  47 y.o.  female with PMH listed below was seen in consultation at the request of  Enid Baas, MD  for evaluation of leukocytosis Reviewed patient's previous blood work. Patient has a history of  leukocytosis since 2016.  Predominantly neutrophilia.  Patient reports having COVID 19 infection in December 2023 and since then she has felt very tired, lack of energy.  She describes the fatigue level as severe as when she needed blood transfusion in the past.   Denies any intentional weight loss, fever.  She endorses mild night sweats 3-4 times per week, which has been a chronic issue for her and has improved comparing to her symptoms right after complete hysterectomy..  Occasional hot flash.  Patient has a history of juvenile rheumatoid arthritis for which she used to see rheumatology.  She has not needed to see rheumatology for a long time.  She has some joint discomfort but overall manageable. Denies any chronic steroid use She is a former smoker, quit in  2018.  INTERVAL HISTORY Amanda English is a 46 y.o. female who has above history reviewed by me today presents for follow up visit for further discussion of her blood work.  She continues to have burning sensation of her muscle. Still very fatigued.   She had blood work done with her primary care provider on 12/18/2022 and she has questions on her cbc tests and would like to further discuss.  Cbc showed normal hemoglobin, increased RDW, decreased lymphocyte, increased neutrophil % and eosinophil% Iron panel showed increased TIBC 498, normal saturation 18, ferritin 34, increased uric acid level 8, CK 46, ESR 7 She has been started on allopurinol which has significantly improved her muscle pains   have history of inflammatory juvenile rheumatoid arthritis    MEDICAL HISTORY:  Past Medical History:  Diagnosis Date   Anxiety    Arthritis    Chiari malformation    Complication of anesthesia    woke during one surgery   Depression    Diabetes mellitus without complication (HCC)    Diabetes mellitus, type II (HCC)    Family history of adverse reaction to anesthesia    Father - woke during one surgery   Fatty liver    GERD (gastroesophageal reflux disease)    High cholesterol    Hypertension    Migraine headache    approx 2x/month   Mitral valve regurgitation    Spina bifida occulta     SURGICAL HISTORY: Past Surgical History:  Procedure Laterality Date   ABDOMINAL HYSTERECTOMY     APPENDECTOMY     CESAREAN SECTION     COLONOSCOPY WITH PROPOFOL N/A 10/04/2018   Procedure: COLONOSCOPY  WITH PROPOFOL;  Surgeon: Toney Reil, MD;  Location: [facility] END[facility]COPY;  Service: Gastroenterology;  Laterality: N/A;   ETHMOIDECTOMY Bilateral 11/17/2019   Procedure: TOTAL ETHMOIDECTOMY WITH FRONTAL SINUOTOMY;  Surgeon: Vernie Murders, MD;  Location: [facility];  Service: ENT;  Laterality: Bilateral;   IMAGE GUIDED SINUS SURGERY Bilateral 11/17/2019   Procedure: IMAGE GUIDED SINUS  SURGERY;  Surgeon: Vernie Murders, MD;  Location: [facility];  Service: ENT;  Laterality: Bilateral;  need stryker disk GAVE DISK TO BRENDA 4-23  KP   MAXILLARY ANTROSTOMY Bilateral 11/17/2019   Procedure: MAXILLARY ANTROSTOMY;  Surgeon: Vernie Murders, MD;  Location: [facility];  Service: ENT;  Laterality: Bilateral;   NOSE SURGERY     STERNAL WIRE REMOVAL     thromectomy     thymus removal     TURBINATE REDUCTION Bilateral 11/17/2019   Procedure: TURBINATE REDUCTION;  Surgeon: Vernie Murders, MD;  Location: [facility];  Service: ENT;  Laterality: Bilateral;  Diabetic - oral meds    SOCIAL HISTORY: Social History   Socioeconomic History   Marital status: Married    Spouse name: Not on file   Number of children: Not on file   Years of education: Not on file   Highest education level: Not on file  Occupational History   Not on file  Tobacco Use   Smoking status: Former    Current packs/day: 0.00    Average packs/day: 0.5 packs/day for 12.0 years (6.0 ttl pk-yrs)    Types: Cigarettes    Start date: 2006    Quit date: 2018    Years since quitting: 6.5   Smokeless tobacco: Never  Vaping Use   Vaping status: Former   Quit date: 09/02/2016   Substances: Nicotine, Flavoring, Nicotine-salt   Devices: SMOK  Substance and Sexual Activity   Alcohol use: No   Drug use: No   Sexual activity: Yes  Other Topics Concern   Not on file  Social History Narrative   Not on file   Social Determinants of Health   Financial Resource Strain: Not on file  Food Insecurity: No Food Insecurity (09/02/2022)   Hunger Vital Sign    Worried About Running Out of Food in the Last Year: Never true    Ran Out of Food in the Last Year: Never true  Transportation Needs: No Transportation Needs (12/29/2022)   Received from [facility]   PRAPARE - Transportation    In the past 12 months, has lack of transportation kept you from  medical appointments or from getting medications?: No    Lack of Transportation (Non-Medical): No  Physical Activity: Not on file  Stress: Not on file  Social Connections: Not on file  Intimate Partner Violence: Not At Risk (09/02/2022)   Humiliation, Afraid, Rape, and Kick questionnaire    Fear of Current or Ex-Partner: No    Emotionally Abused: No    Physically Abused: No    Sexually Abused: No    FAMILY HISTORY: Family History  Problem Relation Age of Onset   Depression Mother    Bipolar disorder Mother    Alzheimer's disease Father    Thyroid disease Brother    Breast cancer Maternal Aunt 45   Thyroid cancer Maternal Grandmother    Leukemia Paternal Grandmother     ALLERGIES:  is allergic to macrobid [nitrofurantoin], ibuprofen, and nsaids.  MEDICATIONS:  Current Outpatient Medications  Medication Sig Dispense Refill   cyanocobalamin (VITAMIN B12)  1000 MCG/ML injection Inject 1,000 mcg into the muscle every 30 (thirty) days.     escitalopram (LEXAPRO) 20 MG tablet Take 20 mg by mouth daily.     Iron-Vitamin C (VITRON-C) 65-125 MG TABS Take 1 tablet by mouth daily at 2 am. 30 tablet 3   ketorolac (TORADOL) 10 MG tablet as needed.   0   Lancets (ONETOUCH DELICA PLUS LANCET33G) MISC USE TO CHECK SUGARS ONCE DAILY DX E11.9     losartan (COZAAR) 25 MG tablet Take 1 tablet by mouth 2 (two) times daily.     metoprolol tartrate (LOPRESSOR) 25 MG tablet Take 1 tablet by mouth 2 (two) times daily.     ondansetron (ZOFRAN) 4 MG tablet Take 4 mg by mouth every 8 (eight) hours as needed for nausea or vomiting.     ONETOUCH VERIO test strip USE TO CHECK SUGARS DAILY     potassium chloride (KLOR-CON) 10 MEQ tablet Take by mouth.     Semaglutide,0.25 or 0.5MG /DOS, 2 MG/3ML SOPN Inject into the skin. Inject 0.75 mLs (0.5 mg total) subcutaneously once a week     traZODone (DESYREL) 100 MG tablet trazodone 100 mg tablet     ALPRAZolam (XANAX) 0.5 MG tablet Take 0.5 mg by mouth every 6 (six)  hours as needed. (Patient not taking: Reported on 10/01/2022)     amLODipine (NORVASC) 2.5 MG tablet Take 2.5 mg by mouth daily. (Patient not taking: Reported on 10/01/2022)     Current Facility-Administered Medications  Medication Dose Route Frequency Provider Last Rate Last Admin   triamcinolone acetonide (KENALOG) 10 MG/ML injection 10 mg  10 mg Other Once Vivi Barrack, DPM       triamcinolone acetonide (KENALOG) 10 MG/ML injection 10 mg  10 mg Other Once Vivi Barrack, DPM        Review of Systems  Constitutional:  Positive for fatigue. Negative for appetite change, chills and fever.  HENT:   Negative for hearing loss and voice change.   Eyes:  Negative for eye problems.  Respiratory:  Negative for chest tightness and cough.   Cardiovascular:  Negative for chest pain.  Gastrointestinal:  Negative for abdominal distention, abdominal pain and blood in stool.  Endocrine: Negative for hot flashes.  Genitourinary:  Negative for difficulty urinating and frequency.   Musculoskeletal:  Negative for arthralgias.  Skin:  Negative for itching and rash.  Neurological:  Negative for extremity weakness.  Hematological:  Negative for adenopathy.  Psychiatric/Behavioral:  Negative for confusion.    PHYSICAL EXAMINATION: ECOG PERFORMANCE STATUS: 1 - Symptomatic but completely ambulatory Vitals:   01/12/23 1055  BP: 122/66  Pulse: 64  Resp: 18  Temp: 98.3 F (36.8 C)  SpO2: 100%   Filed Weights   01/12/23 1055  Weight: 248 lb 14.4 oz (112.9 kg)    Physical Exam Constitutional:      General: She is not in acute distress.    Appearance: She is obese.  HENT:     Head: Normocephalic and atraumatic.  Eyes:     General: No scleral icterus. Cardiovascular:     Rate and Rhythm: Normal rate and regular rhythm.     Heart sounds: Normal heart sounds.  Pulmonary:     Effort: Pulmonary effort is normal. No respiratory distress.     Breath sounds: No wheezing.  Abdominal:      General: Bowel sounds are normal. There is no distension.     Palpations: Abdomen is soft.  Musculoskeletal:  General: No deformity. Normal range of motion.     Cervical back: Normal range of motion and neck supple.  Skin:    General: Skin is warm and dry.     Findings: No erythema or rash.  Neurological:     Mental Status: She is alert and oriented to person, place, and time. Mental status is at baseline.     Cranial Nerves: No cranial nerve deficit.     Coordination: Coordination normal.  Psychiatric:        Mood and Affect: Mood normal.     LABORATORY DATA:  I have reviewed the data as listed    Latest Ref Rng & Units 09/02/2022   10:12 AM 01/25/2021    6:24 PM 08/27/2020   10:05 PM  CBC  WBC 4.0 - 10.5 K/uL 9.7  13.6  11.0   Hemoglobin 12.0 - 15.0 g/dL 91.4  78.2  95.6   Hematocrit 36.0 - 46.0 % 38.1  35.8  36.6   Platelets 150 - 400 K/uL 321  319  317       Latest Ref Rng & Units 09/02/2022   10:12 AM 01/25/2021    6:24 PM 08/27/2020   10:05 PM  CMP  Glucose 70 - 99 mg/dL 213  086  89   BUN 6 - 20 mg/dL 11  8  9    Creatinine 0.44 - 1.00 mg/dL 5.78  4.69  6.29   Sodium 135 - 145 mmol/L 139  132  136   Potassium 3.5 - 5.1 mmol/L 3.5  3.8  3.9   Chloride 98 - 111 mmol/L 104  99  99   CO2 22 - 32 mmol/L 27  23  25    Calcium 8.9 - 10.3 mg/dL 9.5  9.3  9.3   Total Protein 6.5 - 8.1 g/dL 7.8   7.1   Total Bilirubin 0.3 - 1.2 mg/dL 0.2   0.9   Alkaline Phos 38 - 126 U/L 96   84   AST 15 - 41 U/L 18   27   ALT 0 - 44 U/L 20   33       RADIOGRAPHIC STUDIES: I have personally reviewed the radiological images as listed and agreed with the findings in the report. CT ABDOMEN PELVIS W CONTRAST  Result Date: 12/05/2022 CLINICAL DATA:  Generalized abdominal pain and pressure for 1 week. Previous hysterectomy and appendectomy. EXAM: CT ABDOMEN AND PELVIS WITH CONTRAST TECHNIQUE: Multidetector CT imaging of the abdomen and pelvis was performed using the standard protocol  following bolus administration of intravenous contrast. RADIATION DOSE REDUCTION: This exam was performed according to the departmental dose-optimization program which includes automated exposure control, adjustment of the mA and/or kV according to patient size and/or use of iterative reconstruction technique. CONTRAST:  ISOVUE-300 IOPAMIDOL (ISOVUE-300) INJECTION 61% COMPARISON:  Noncontrast CT on 01/25/2021 FINDINGS: Lower Chest: No acute findings. Hepatobiliary: No hepatic masses identified. Gallbladder is unremarkable. No evidence of biliary ductal dilatation. Pancreas:  No mass or inflammatory changes. Spleen: Within normal limits in size and appearance. Adrenals/Urinary Tract: No suspicious masses identified. No evidence of ureteral calculi or hydronephrosis. Stomach/Bowel: No evidence of obstruction, inflammatory process or abnormal fluid collections. Vascular/Lymphatic: No pathologically enlarged lymph nodes. No acute vascular findings. Aortic atherosclerotic calcification incidentally noted. Reproductive: Prior hysterectomy noted. Adnexal regions are unremarkable in appearance. Other:  None. Musculoskeletal:  No suspicious bone lesions identified. IMPRESSION: No acute findings or other significant abnormality. Aortic Atherosclerosis (ICD10-I70.0). Electronically Signed   By: Jonny Ruiz  Geanie Cooley M.D.   On: 12/05/2022 10:05

## 2023-01-31 NOTE — Assessment & Plan Note (Signed)
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 CBC showed no leukocytosis, no M protein on multiple myeloma panel, normal light chain ratio,negative peripheral blood flow cytometry, normal LDH,negative  HIV,negative  hepatitis, negative ANA, elevated CRP 2.4, elevated  ESR 22, indicating chronic inflammation. Negative EBV and CMV I will hold off additional work up for now. Recommend patient to further discuss with primary care provider regarding rheumatology evaluation.

## 2023-03-27 ENCOUNTER — Other Ambulatory Visit: Payer: Self-pay | Admitting: Family Medicine

## 2023-03-27 ENCOUNTER — Ambulatory Visit
Admission: RE | Admit: 2023-03-27 | Discharge: 2023-03-27 | Disposition: A | Discharge status: Home / Self Care | Payer: BC Managed Care – PPO | Source: Ambulatory Visit | Attending: Family Medicine | Admitting: Family Medicine

## 2023-03-27 DIAGNOSIS — R1032 Left lower quadrant pain: Secondary | ICD-10-CM | POA: Diagnosis present

## 2023-03-27 MED ORDER — IOHEXOL 300 MG/ML  SOLN
100.0000 mL | Freq: Once | INTRAMUSCULAR | Status: AC | PRN
  Administered 2023-03-27: 100 mL via INTRAVENOUS

## 2023-03-27 MED ORDER — IOHEXOL 300 MG/ML  SOLN: 100.0000 mL | Freq: Once | INTRAMUSCULAR | Status: DC | PRN

## 2023-03-30 ENCOUNTER — Other Ambulatory Visit: Payer: Self-pay | Admitting: Family Medicine

## 2023-03-30 DIAGNOSIS — R1032 Left lower quadrant pain: Secondary | ICD-10-CM

## 2023-03-30 DIAGNOSIS — R112 Nausea with vomiting, unspecified: Secondary | ICD-10-CM

## 2023-04-08 ENCOUNTER — Inpatient Hospital Stay: Payer: BC Managed Care – PPO | Attending: Oncology

## 2023-04-15 ENCOUNTER — Encounter: Payer: Self-pay | Admitting: Oncology

## 2023-04-15 ENCOUNTER — Inpatient Hospital Stay: Payer: BC Managed Care – PPO | Admitting: Oncology

## 2023-04-15 ENCOUNTER — Inpatient Hospital Stay: Payer: BC Managed Care – PPO

## 2024-04-20 ENCOUNTER — Other Ambulatory Visit: Payer: Self-pay

## 2024-04-20 DIAGNOSIS — E88819 Insulin resistance, unspecified: Secondary | ICD-10-CM

## 2024-04-20 DIAGNOSIS — E249 Cushing's syndrome, unspecified: Secondary | ICD-10-CM

## 2024-04-20 DIAGNOSIS — R531 Weakness: Secondary | ICD-10-CM

## 2024-05-04 ENCOUNTER — Ambulatory Visit: Admission: RE | Admit: 2024-05-04 | Discharge: 2024-05-04 | Disposition: A | Source: Ambulatory Visit

## 2024-05-04 ENCOUNTER — Ambulatory Visit
Admission: RE | Admit: 2024-05-04 | Discharge: 2024-05-04 | Disposition: A | Discharge status: Home / Self Care | Source: Ambulatory Visit

## 2024-05-04 ENCOUNTER — Other Ambulatory Visit

## 2024-05-04 DIAGNOSIS — E249 Cushing's syndrome, unspecified: Secondary | ICD-10-CM

## 2024-05-04 DIAGNOSIS — E88819 Insulin resistance, unspecified: Secondary | ICD-10-CM

## 2024-05-04 DIAGNOSIS — R531 Weakness: Secondary | ICD-10-CM

## 2024-05-04 MED ORDER — GADOPICLENOL 0.5 MMOL/ML IV SOLN
10.0000 mL | Freq: Once | INTRAVENOUS | Status: AC | PRN
  Administered 2024-05-04: 10 mL via INTRAVENOUS
